# Patient Record
Sex: Male | Born: 1945 | Hispanic: No | Marital: Married | Smoking: Never smoker
Health system: Southern US, Community
[De-identification: ages and names within clinical notes are randomized; demographics above are authoritative.]

## PROBLEM LIST (undated history)

## (undated) DIAGNOSIS — H269 Unspecified cataract: Secondary | ICD-10-CM

## (undated) HISTORY — DX: Unspecified cataract: H26.9

## (undated) HISTORY — PX: TONSILLECTOMY: SUR1361

---

## 2009-03-26 ENCOUNTER — Encounter (INDEPENDENT_AMBULATORY_CARE_PROVIDER_SITE_OTHER): Payer: Self-pay | Admitting: *Deleted

## 2010-05-10 HISTORY — PX: OTHER SURGICAL HISTORY: SHX169

## 2013-04-27 ENCOUNTER — Encounter (INDEPENDENT_AMBULATORY_CARE_PROVIDER_SITE_OTHER): Payer: Self-pay | Admitting: Ophthalmology

## 2013-05-23 ENCOUNTER — Encounter (INDEPENDENT_AMBULATORY_CARE_PROVIDER_SITE_OTHER): Payer: Self-pay | Admitting: Ophthalmology

## 2013-05-31 ENCOUNTER — Encounter (INDEPENDENT_AMBULATORY_CARE_PROVIDER_SITE_OTHER): Payer: Self-pay | Admitting: Ophthalmology

## 2013-06-08 ENCOUNTER — Encounter (INDEPENDENT_AMBULATORY_CARE_PROVIDER_SITE_OTHER): Payer: Self-pay | Admitting: Ophthalmology

## 2013-06-14 ENCOUNTER — Encounter (INDEPENDENT_AMBULATORY_CARE_PROVIDER_SITE_OTHER): Payer: Self-pay | Admitting: Ophthalmology

## 2013-06-21 ENCOUNTER — Encounter (INDEPENDENT_AMBULATORY_CARE_PROVIDER_SITE_OTHER): Payer: Self-pay | Admitting: Ophthalmology

## 2013-06-27 ENCOUNTER — Encounter (INDEPENDENT_AMBULATORY_CARE_PROVIDER_SITE_OTHER): Payer: Self-pay | Admitting: Ophthalmology

## 2016-10-26 ENCOUNTER — Ambulatory Visit (INDEPENDENT_AMBULATORY_CARE_PROVIDER_SITE_OTHER): Payer: Medicare Other | Admitting: Orthopaedic Surgery

## 2016-10-26 ENCOUNTER — Encounter (INDEPENDENT_AMBULATORY_CARE_PROVIDER_SITE_OTHER): Payer: Self-pay | Admitting: Orthopaedic Surgery

## 2016-10-26 VITALS — BP 127/77 | HR 68 | Ht 69.0 in | Wt 203.0 lb

## 2016-10-26 DIAGNOSIS — M7712 Lateral epicondylitis, left elbow: Secondary | ICD-10-CM | POA: Diagnosis not present

## 2016-10-26 NOTE — Progress Notes (Signed)
Office Visit Note   Patient: Steven Bonilla           Date of Birth: 01/02/1946           MRN: [id number] Visit Date: 10/26/2016              Requested by: No referring provider defined for this encounter. PCP: No primary care provider on file.   Assessment & Plan: Visit Diagnoses:  1. Lateral epicondylitis, left elbow     Plan: Injection performed to continue with ice intermittent Advil. He can use his tennis elbow splint. He'll let me know if he has persistent problems.  Follow-Up Instructions: Return if symptoms worsen or fail to improve.   Orders:  Orders Placed This Encounter  Procedures  . Medium Joint Injection/Arthrocentesis   No orders of the defined types were placed in this encounter.     Procedures: Medium Joint Inj Date/Time: 10/26/2016 11:24 AM Performed by: Marybelle Killings Authorized by: Rodell Perna C   Consent Given by:  Patient Indications:  Pain Location:  Elbow Site:  L lateral epicondyle Needle Size:  22 G Needle Length:  1.5 inches Approach:  Anterolateral Ultrasound Guided: No   Fluoroscopic Guidance: No   Medications:  1 mL bupivacaine 0.5 %; 1 mL lidocaine 1 %; 40 mg methylPREDNISolone acetate 40 MG/ML Patient tolerance:  Patient tolerated the procedure well with no immediate complications      Clinical Data: No additional findings.   Subjective: Chief Complaint  Patient presents with  . Left Elbow - Pain    HPI 71-year-old male with left elbow pain. States had an injection 2016 did well. He said increased left elbow pain for 3-4 weeks, he wore tennis elbow band and states his pain is increased despite Advil, blue emu cream, and rest. He states he has some matches coming up shortly that he has to plan any requested repeat injection.   Review of Systems patient had been very healthy had cataract surgery 2012 otherwise is not on any medication works in Insurance underwriter drinks a glass of wine nightly otherwise negative 14 point review of  systems.   Objective: Vital Signs: BP 127/77   Pulse 68   Ht 5\' 9"  (1.753 m)   Wt 203 lb (92.1 kg)   BMI 29.98 kg/m   Physical Exam  Constitutional: He is oriented to person, place, and time. He appears well-developed and well-nourished.  HENT:  Head: Normocephalic and atraumatic.  Eyes: EOM are normal. Pupils are equal, round, and reactive to light.  Neck: No tracheal deviation present. No thyromegaly present.  Cardiovascular: Normal rate.   Pulmonary/Chest: Effort normal. He has no wheezes.  Abdominal: Soft. Bowel sounds are normal.  Musculoskeletal:  Patient has good cervical range of motion of shoulder impingement. Exquisite tenderness over his left lateral epicondyle pain with resisted wrist extension no palpable defects. Collateral ligaments are stable radial head is normal. Pain with gripping and squeezing. Medial epicondyle is normal to palpation full elbow range of motion normal supination pronation.  Neurological: He is alert and oriented to person, place, and time.  Skin: Skin is warm and dry. Capillary refill takes less than 2 seconds.  Psychiatric: He has a normal mood and affect. His behavior is normal. Judgment and thought content normal.    Ortho Exam  Specialty Comments:  No specialty comments available.  Imaging: No results found.   PMFS History: Patient Active Problem List   Diagnosis Date Noted  . Lateral epicondylitis, left elbow 10/28/2016  Past Medical History:  Diagnosis Date  . Cataracts, bilateral     No family history on file.  Past Surgical History:  Procedure Laterality Date  . cataracts  2012   Social History   Occupational History  . Not on file.   Social History Main Topics  . Smoking status: Never Smoker  . Smokeless tobacco: Never Used  . Alcohol use Yes  . Drug use: No  . Sexual activity: Not on file

## 2016-10-28 DIAGNOSIS — M7712 Lateral epicondylitis, left elbow: Secondary | ICD-10-CM | POA: Insufficient documentation

## 2016-10-28 MED ORDER — BUPIVACAINE HCL 0.5 % IJ SOLN
1.0000 mL | INTRAMUSCULAR | Status: AC | PRN
  Administered 2016-10-26: 1 mL via INTRA_ARTICULAR

## 2016-10-28 MED ORDER — LIDOCAINE HCL 1 % IJ SOLN
1.0000 mL | INTRAMUSCULAR | Status: AC | PRN
  Administered 2016-10-26: 1 mL

## 2016-10-28 MED ORDER — METHYLPREDNISOLONE ACETATE 40 MG/ML IJ SUSP
40.0000 mg | INTRAMUSCULAR | Status: AC | PRN
  Administered 2016-10-26: 40 mg via INTRA_ARTICULAR

## 2018-11-29 ENCOUNTER — Other Ambulatory Visit: Payer: Self-pay

## 2018-11-29 DIAGNOSIS — Z20822 Contact with and (suspected) exposure to covid-19: Secondary | ICD-10-CM

## 2018-12-01 LAB — NOVEL CORONAVIRUS, NAA: SARS-CoV-2, NAA: NOT DETECTED

## 2018-12-01 LAB — SPECIMEN STATUS REPORT

## 2018-12-07 ENCOUNTER — Encounter: Payer: Self-pay | Admitting: Gastroenterology

## 2018-12-07 ENCOUNTER — Encounter: Payer: Self-pay | Admitting: Family Medicine

## 2018-12-07 ENCOUNTER — Ambulatory Visit (INDEPENDENT_AMBULATORY_CARE_PROVIDER_SITE_OTHER): Payer: Medicare Other | Admitting: Family Medicine

## 2018-12-07 VITALS — BP 118/70 | HR 67 | Ht 69.0 in | Wt 199.0 lb

## 2018-12-07 DIAGNOSIS — Z0000 Encounter for general adult medical examination without abnormal findings: Secondary | ICD-10-CM | POA: Diagnosis not present

## 2018-12-07 DIAGNOSIS — N529 Male erectile dysfunction, unspecified: Secondary | ICD-10-CM

## 2018-12-07 DIAGNOSIS — Z125 Encounter for screening for malignant neoplasm of prostate: Secondary | ICD-10-CM | POA: Diagnosis not present

## 2018-12-07 DIAGNOSIS — Z0001 Encounter for general adult medical examination with abnormal findings: Secondary | ICD-10-CM | POA: Insufficient documentation

## 2018-12-07 DIAGNOSIS — Z131 Encounter for screening for diabetes mellitus: Secondary | ICD-10-CM | POA: Insufficient documentation

## 2018-12-07 LAB — COMPREHENSIVE METABOLIC PANEL
ALT: 13 U/L (ref 0–53)
AST: 16 U/L (ref 0–37)
Albumin: 4.3 g/dL (ref 3.5–5.2)
Alkaline Phosphatase: 37 U/L — ABNORMAL LOW (ref 39–117)
BUN: 13 mg/dL (ref 6–23)
CO2: 28 mEq/L (ref 19–32)
Calcium: 9.2 mg/dL (ref 8.4–10.5)
Chloride: 104 mEq/L (ref 96–112)
Creatinine, Ser: 1.05 mg/dL (ref 0.40–1.50)
GFR: 69.25 mL/min (ref >60.00)
Glucose, Bld: 109 mg/dL — ABNORMAL HIGH (ref 70–99)
Potassium: 4.3 mEq/L (ref 3.5–5.1)
Sodium: 138 mEq/L (ref 135–145)
Total Bilirubin: 0.9 mg/dL (ref 0.2–1.2)
Total Protein: 6.8 g/dL (ref 6.0–8.3)

## 2018-12-07 LAB — LIPID PANEL
Cholesterol: 221 mg/dL — ABNORMAL HIGH (ref 0–200)
HDL: 56.9 mg/dL (ref >39.00)
LDL Cholesterol: 146 mg/dL — ABNORMAL HIGH (ref 0–99)
NonHDL: 163.89
Total CHOL/HDL Ratio: 4
Triglycerides: 91 mg/dL (ref 0.0–149.0)
VLDL: 18.2 mg/dL (ref 0.0–40.0)

## 2018-12-07 LAB — PSA: PSA: 1.14 ng/mL (ref 0.10–4.00)

## 2018-12-07 LAB — URINALYSIS, ROUTINE W REFLEX MICROSCOPIC
Bilirubin Urine: NEGATIVE
Ketones, ur: NEGATIVE
Leukocytes,Ua: NEGATIVE
Nitrite: NEGATIVE
RBC / HPF: NONE SEEN (ref 0–?)
Specific Gravity, Urine: 1.025 (ref 1.000–1.030)
Total Protein, Urine: NEGATIVE
Urine Glucose: NEGATIVE
Urobilinogen, UA: 1 (ref 0.0–1.0)
WBC, UA: NONE SEEN (ref 0–?)
pH: 5.5 (ref 5.0–8.0)

## 2018-12-07 LAB — CBC
HCT: 44.9 % (ref 39.0–52.0)
Hemoglobin: 15.2 g/dL (ref 13.0–17.0)
MCHC: 33.9 g/dL (ref 30.0–36.0)
MCV: 99.9 fl (ref 78.0–100.0)
Platelets: 240 10*3/uL (ref 150.0–400.0)
RBC: 4.5 Mil/uL (ref 4.22–5.81)
RDW: 12.7 % (ref 11.5–15.5)
WBC: 5 10*3/uL (ref 4.0–10.5)

## 2018-12-07 MED ORDER — SILDENAFIL CITRATE 20 MG PO TABS: ORAL_TABLET | ORAL | 1 refills | Status: DC

## 2018-12-07 NOTE — Patient Instructions (Signed)
Health Maintenance After Age 73 After age 39, you are at a higher risk for certain long-term diseases and infections as well as injuries from falls. Falls are a major cause of broken bones and head injuries in people who are older than age 66. Getting regular preventive care can help to keep you healthy and well. Preventive care includes getting regular testing and making lifestyle changes as recommended by your health care provider. Talk with your health care provider about:  Which screenings and tests you should have. A screening is a test that checks for a disease when you have no symptoms.  A diet and exercise plan that is right for you. What should I know about screenings and tests to prevent falls? Screening and testing are the best ways to find a health problem early. Early diagnosis and treatment give you the best chance of managing medical conditions that are common after age 8. Certain conditions and lifestyle choices may make you more likely to have a fall. Your health care provider may recommend:  Regular vision checks. Poor vision and conditions such as cataracts can make you more likely to have a fall. If you wear glasses, make sure to get your prescription updated if your vision changes.  Medicine review. Work with your health care provider to regularly review all of the medicines you are taking, including over-the-counter medicines. Ask your health care provider about any side effects that may make you more likely to have a fall. Tell your health care provider if any medicines that you take make you feel dizzy or sleepy.  Osteoporosis screening. Osteoporosis is a condition that causes the bones to get weaker. This can make the bones weak and cause them to break more easily.  Blood pressure screening. Blood pressure changes and medicines to control blood pressure can make you feel dizzy.  Strength and balance checks. Your health care provider may recommend certain tests to check your  strength and balance while standing, walking, or changing positions.  Foot health exam. Foot pain and numbness, as well as not wearing proper footwear, can make you more likely to have a fall.  Depression screening. You may be more likely to have a fall if you have a fear of falling, feel emotionally low, or feel unable to do activities that you used to do.  Alcohol use screening. Using too much alcohol can affect your balance and may make you more likely to have a fall. What actions can I take to lower my risk of falls? General instructions  Talk with your health care provider about your risks for falling. Tell your health care provider if: ? You fall. Be sure to tell your health care provider about all falls, even ones that seem minor. ? You feel dizzy, sleepy, or off-balance.  Take over-the-counter and prescription medicines only as told by your health care provider. These include any supplements.  Eat a healthy diet and maintain a healthy weight. A healthy diet includes low-fat dairy products, low-fat (lean) meats, and fiber from whole grains, beans, and lots of fruits and vegetables. Home safety  Remove any tripping hazards, such as rugs, cords, and clutter.  Install safety equipment such as grab bars in bathrooms and safety rails on stairs.  Keep rooms and walkways well-lit. Activity   Follow a regular exercise program to stay fit. This will help you maintain your balance. Ask your health care provider what types of exercise are appropriate for you.  If you need a cane or  walker, use it as recommended by your health care provider.  Wear supportive shoes that have nonskid soles. Lifestyle  Do not drink alcohol if your health care provider tells you not to drink.  If you drink alcohol, limit how much you have: ? 0-1 drink a day for women. ? 0-2 drinks a day for men.  Be aware of how much alcohol is in your drink. In the [place] one drink equals one typical bottle of beer (12  oz), one-half glass of wine (5 oz), or one shot of hard liquor (1 oz).  Do not use any products that contain nicotine or tobacco, such as cigarettes and e-cigarettes. If you need help quitting, ask your health care provider. Summary  Having a healthy lifestyle and getting preventive care can help to protect your health and wellness after age 65.  Screening and testing are the best way to find a health problem early and help you avoid having a fall. Early diagnosis and treatment give you the best chance for managing medical conditions that are more common for people who are older than age 65.  Falls are a major cause of broken bones and head injuries in people who are older than age 65. Take precautions to prevent a fall at home.  Work with your health care provider to learn what changes you can make to improve your health and wellness and to prevent falls. This information is not intended to replace advice given to you by your health care provider. Make sure you discuss any questions you have with your health care provider. Document Released: 03/09/2017 Document Revised: 08/17/2018 Document Reviewed: 03/09/2017 Elsevier Patient Education  2020 [employer] Inc[employer]  Preventive Care 65 Years and Older, Male Preventive care refers to lifestyle choices and visits with your health care provider that can promote health and wellness. This includes:  A yearly physical exam. This is also called an annual well check.  Regular dental and eye exams.  Immunizations.  Screening for certain conditions.  Healthy lifestyle choices, such as diet and exercise. What can I expect for my preventive care visit? Physical exam Your health care provider will check:  Height and weight. These may be used to calculate body mass index (BMI), which is a measurement that tells if you are at a healthy weight.  Heart rate and blood pressure.  Your skin for abnormal spots. Counseling Your health care provider may ask  you questions about:  Alcohol, tobacco, and drug use.  Emotional well-being.  Home and relationship well-being.  Sexual activity.  Eating habits.  History of falls.  Memory and ability to understand (cognition).  Work and work environment. What immunizations do I need?  Influenza (flu) vaccine  This is recommended every year. Tetanus, diphtheria, and pertussis (Tdap) vaccine  You may need a Td booster every 10 years. Varicella (chickenpox) vaccine  You may need this vaccine if you have not already been vaccinated. Zoster (shingles) vaccine  You may need this after age 60. Pneumococcal conjugate (PCV13) vaccine  One dose is recommended after age 65. Pneumococcal polysaccharide (PPSV23) vaccine  One dose is recommended after age 65. Measles, mumps, and rubella (MMR) vaccine  You may need at least one dose of MMR if you were born in 1957 or later. You may also need a second dose. Meningococcal conjugate (MenACWY) vaccine  You may need this if you have certain conditions. Hepatitis A vaccine  You may need this if you have certain conditions or if you travel or work   in places where you may be exposed to hepatitis A. Hepatitis B vaccine  You may need this if you have certain conditions or if you travel or work in places where you may be exposed to hepatitis B. Haemophilus influenzae type b (Hib) vaccine  You may need this if you have certain conditions. You may receive vaccines as individual doses or as more than one vaccine together in one shot (combination vaccines). Talk with your health care provider about the risks and benefits of combination vaccines. What tests do I need? Blood tests  Lipid and cholesterol levels. These may be checked every 5 years, or more frequently depending on your overall health.  Hepatitis C test.  Hepatitis B test. Screening  Lung cancer screening. You may have this screening every year starting at age 33 if you have a  30-pack-year history of smoking and currently smoke or have quit within the past 15 years.  Colorectal cancer screening. All adults should have this screening starting at age 23 and continuing until age 65. Your health care provider may recommend screening at age 85 if you are at increased risk. You will have tests every 1-10 years, depending on your results and the type of screening test.  Prostate cancer screening. Recommendations will vary depending on your family history and other risks.  Diabetes screening. This is done by checking your blood sugar (glucose) after you have not eaten for a while (fasting). You may have this done every 1-3 years.  Abdominal aortic aneurysm (AAA) screening. You may need this if you are a current or former smoker.  Sexually transmitted disease (STD) testing. Follow these instructions at home: Eating and drinking  Eat a diet that includes fresh fruits and vegetables, whole grains, lean protein, and low-fat dairy products. Limit your intake of foods with high amounts of sugar, saturated fats, and salt.  Take vitamin and mineral supplements as recommended by your health care provider.  Do not drink alcohol if your health care provider tells you not to drink.  If you drink alcohol: ? Limit how much you have to 0-2 drinks a day. ? Be aware of how much alcohol is in your drink. In the [place] one drink equals one 12 oz bottle of beer (355 mL), one 5 oz glass of wine (148 mL), or one 1 oz glass of hard liquor (44 mL). Lifestyle  Take daily care of your teeth and gums.  Stay active. Exercise for at least 30 minutes on 5 or more days each week.  Do not use any products that contain nicotine or tobacco, such as cigarettes, e-cigarettes, and chewing tobacco. If you need help quitting, ask your health care provider.  If you are sexually active, practice safe sex. Use a condom or other form of protection to prevent STIs (sexually transmitted infections).  Talk  with your health care provider about taking a low-dose aspirin or statin. What's next?  Visit your health care provider once a year for a well check visit.  Ask your health care provider how often you should have your eyes and teeth checked.  Stay up to date on all vaccines. This information is not intended to replace advice given to you by your health care provider. Make sure you discuss any questions you have with your health care provider. Document Released: 05/23/2015 Document Revised: 04/20/2018 Document Reviewed: 04/20/2018 Elsevier Patient Education  2020 [employer].

## 2018-12-07 NOTE — Progress Notes (Addendum)
Established Patient Office Visit  Subjective:  Patient ID: Steven Bonilla, male    DOB: August 04, 1945  Age: 73 y.o. MRN: [id number]  CC:  Chief Complaint  Patient presents with  . Establish Care  . Annual Exam    HPI Bentzion Dauria presents for routine complete physical.  Patient has no chronic illnesses that he is aware of.  He enjoys good health.  Has not had a regular checkup in years and his wife has been compelling him to get 1.  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  He is active by playing golf.  He does not smoke or use illicit drugs.  He will drink 1 glass of red wine daily on a regular basis.  Occasional difficulty with ED but denies decrease in libido.  Urine flow is good.  Has not had a colonoscopy in years.  No recent eye check.  Is able to see the dentist regularly.  Past Medical History:  Diagnosis Date  . Cataracts, bilateral     Past Surgical History:  Procedure Laterality Date  . cataracts  2012    History reviewed. No pertinent family history.  Social History   Socioeconomic History  . Marital status: Divorced    Spouse name: Not on file  . Number of children: Not on file  . Years of education: Not on file  . Highest education level: Not on file  Occupational History  . Not on file  Social Needs  . Financial resource strain: Not on file  . Food insecurity    Worry: Not on file    Inability: Not on file  . Transportation needs    Medical: Not on file    Non-medical: Not on file  Tobacco Use  . Smoking status: Never Smoker  . Smokeless tobacco: Never Used  Substance and Sexual Activity  . Alcohol use: Yes  . Drug use: No  . Sexual activity: Not on file  Lifestyle  . Physical activity    Days per week: Not on file    Minutes per session: Not on file  . Stress: Not on file  Relationships  . Social Herbalist on phone: Not on file    Gets together: Not on file    Attends religious service: Not on file    Active member of club  or organization: Not on file    Attends meetings of clubs or organizations: Not on file    Relationship status: Not on file  . Intimate partner violence    Fear of current or ex partner: Not on file    Emotionally abused: Not on file    Physically abused: Not on file    Forced sexual activity: Not on file  Other Topics Concern  . Not on file  Social History Narrative  . Not on file    No outpatient medications prior to visit.   No facility-administered medications prior to visit.     No Known Allergies  ROS Review of Systems  Constitutional: Negative.   HENT: Negative.   Eyes: Negative for photophobia and visual disturbance.  Respiratory: Negative.   Cardiovascular: Negative.   Gastrointestinal: Negative.   Endocrine: Negative for polyphagia and polyuria.  Genitourinary: Negative for difficulty urinating, frequency and urgency.  Musculoskeletal: Negative for gait problem and joint swelling.  Skin: Negative for pallor.  Allergic/Immunologic: Negative for immunocompromised state.  Neurological: Negative for light-headedness and headaches.  Hematological: Does not bruise/bleed easily.  Psychiatric/Behavioral: Negative.  Objective:    Physical Exam  Constitutional: He is oriented to person, place, and time. He appears well-developed and well-nourished. No distress.  HENT:  Head: Normocephalic and atraumatic.  Right Ear: External ear normal.  Left Ear: External ear normal.  Mouth/Throat: Oropharynx is clear and moist. No oropharyngeal exudate.  Eyes: Pupils are equal, round, and reactive to light. Conjunctivae are normal. Right eye exhibits no discharge. Left eye exhibits no discharge. No scleral icterus.  Neck: No JVD present. No tracheal deviation present. No thyromegaly present.  Cardiovascular: Normal rate, regular rhythm and normal heart sounds.  Pulmonary/Chest: Effort normal and breath sounds normal. No stridor. No respiratory distress. He has no wheezes. He  has no rales.  Abdominal: Bowel sounds are normal. He exhibits no distension. There is no abdominal tenderness. There is no rebound and no guarding.  Genitourinary: Rectum:     Guaiac result negative.     No rectal mass, anal fissure, tenderness, external hemorrhoid, internal hemorrhoid or abnormal anal tone.  Prostate is enlarged. Prostate is not tender.  Musculoskeletal:        General: No edema.  Lymphadenopathy:    He has no cervical adenopathy.  Neurological: He is alert and oriented to person, place, and time.  Skin: Skin is warm and dry. He is not diaphoretic.  Psychiatric: He has a normal mood and affect. His behavior is normal.    BP 118/70   Pulse 67   Ht _0  (1.753 m)   Wt 199 lb (90.3 kg)   SpO2 100%   BMI 29.39 kg/m  Wt Readings from Last 3 Encounters:  12/07/18 199 lb (90.3 kg)  10/26/16 203 lb (92.1 kg)   BP Readings from Last 3 Encounters:  12/07/18 118/70  10/26/16 127/77   Guideline developer:  UpToDate (see UpToDate for funding source) Date Released: June 2014  Health Maintenance Due  Topic Date Due  . Hepatitis C Screening  06-16-1945  . COLONOSCOPY  01/07/1996    There are no preventive care reminders to display for this patient.  No results found for: TSH No results found for: WBC, HGB, HCT, MCV, PLT No results found for: NA, K, CHLORIDE, CO2, GLUCOSE, BUN, CREATININE, BILITOT, ALKPHOS, AST, ALT, PROT, ALBUMIN, CALCIUM, ANIONGAP, EGFR, GFR No results found for: CHOL No results found for: HDL No results found for: LDLCALC No results found for: TRIG No results found for: CHOLHDL No results found for: HGBA1C    Assessment & Plan:   Problem List Items Addressed This Visit      Other   Vasculogenic erectile dysfunction   Relevant Medications   sildenafil (REVATIO) 20 MG tablet   Other Relevant Orders   CBC   Lipid panel   Healthcare maintenance - Primary   Relevant Orders   Comprehensive metabolic panel   Lipid panel   PSA    Urinalysis, Routine w reflex microscopic   Ambulatory referral to Gastroenterology   Ambulatory referral to Ophthalmology      Meds ordered this encounter  Medications  . sildenafil (REVATIO) 20 MG tablet    Sig: Take 3-5 daily  45 minutes prior as needed.    Dispense:  50 tablet    Refill:  1  The 10-year ASCVD risk score Mikey Bussing DC Jr., et al., 2013) is: 18%   Values used to calculate the score:     Age: 14 years     Sex: Male     Is Non-Hispanic African American: No  Diabetic: No     Tobacco smoker: No     Systolic Blood Pressure: 262 mmHg     Is BP treated: No     HDL Cholesterol: 56.9 mg/dL     Total Cholesterol: 221 mg/dL  Follow-up: Return in about 6 months (around 06/09/2019).

## 2018-12-26 ENCOUNTER — Other Ambulatory Visit: Payer: Self-pay

## 2018-12-26 ENCOUNTER — Ambulatory Visit (AMBULATORY_SURGERY_CENTER): Payer: Self-pay | Admitting: *Deleted

## 2018-12-26 VITALS — Temp 96.8°F | Ht 69.5 in | Wt 201.0 lb

## 2018-12-26 DIAGNOSIS — Z1211 Encounter for screening for malignant neoplasm of colon: Secondary | ICD-10-CM

## 2018-12-26 NOTE — Progress Notes (Signed)
No egg or soy allergy known to patient  No issues with past sedation with any surgeries  or procedures, no intubation problems  No diet pills per patient No home 02 use per patient  No blood thinners per patient  Pt denies issues with constipation  No A fib or A flutter  EMMI video sent to pt's e mail  

## 2019-01-09 ENCOUNTER — Encounter: Payer: Medicare Other | Admitting: Gastroenterology

## 2019-01-24 ENCOUNTER — Encounter: Payer: Medicare Other | Admitting: Gastroenterology

## 2019-02-12 ENCOUNTER — Encounter: Payer: Medicare Other | Admitting: Gastroenterology

## 2019-02-19 ENCOUNTER — Telehealth: Payer: Self-pay

## 2019-02-19 NOTE — Telephone Encounter (Signed)
Covid-19 screening questions   Do you now or have you had a fever in the last 14 days?  Do you have any respiratory symptoms of shortness of breath or cough now or in the last 14 days?  Do you have any family members or close contacts with diagnosed or suspected Covid-19 in the past 14 days?  Have you been tested for Covid-19 and found to be positive?       

## 2019-02-20 ENCOUNTER — Encounter: Payer: Self-pay | Admitting: Gastroenterology

## 2019-02-20 ENCOUNTER — Ambulatory Visit (AMBULATORY_SURGERY_CENTER): Payer: Medicare Other | Admitting: Gastroenterology

## 2019-02-20 ENCOUNTER — Other Ambulatory Visit: Payer: Self-pay

## 2019-02-20 VITALS — BP 95/57 | HR 55 | Temp 97.8°F | Resp 9 | Ht 70.0 in | Wt 201.0 lb

## 2019-02-20 DIAGNOSIS — Z1211 Encounter for screening for malignant neoplasm of colon: Secondary | ICD-10-CM

## 2019-02-20 DIAGNOSIS — D122 Benign neoplasm of ascending colon: Secondary | ICD-10-CM

## 2019-02-20 DIAGNOSIS — D123 Benign neoplasm of transverse colon: Secondary | ICD-10-CM

## 2019-02-20 DIAGNOSIS — D124 Benign neoplasm of descending colon: Secondary | ICD-10-CM | POA: Diagnosis not present

## 2019-02-20 DIAGNOSIS — D125 Benign neoplasm of sigmoid colon: Secondary | ICD-10-CM | POA: Diagnosis not present

## 2019-02-20 MED ORDER — SODIUM CHLORIDE 0.9 % IV SOLN: 500.0000 mL | Freq: Once | INTRAVENOUS | Status: DC

## 2019-02-20 NOTE — Progress Notes (Signed)
PT taken to PACU. Monitors in place. VSS. Report given to RN. 

## 2019-02-20 NOTE — Progress Notes (Signed)
Pt's states no medical or surgical changes since previsit or office visit. 

## 2019-02-20 NOTE — Op Note (Signed)
[facility] Patient Name: Steven Bonilla Procedure Date: 02/20/2019 12:04 PM MRN: [id number] Endoscopist: Remo Lipps P. Havery Moros , MD Age: 73 Referring MD:  Date of Birth: 1946/02/01 Gender: Male Account #: [id number] Procedure:                Colonoscopy Indications:              Screening for colorectal malignant neoplasm, This                            is the patient's first colonoscopy Medicines:                Monitored Anesthesia Care Procedure:                Pre-Anesthesia Assessment:                           - Prior to the procedure, a History and Physical                            was performed, and patient medications and                            allergies were reviewed. The patient's tolerance of                            previous anesthesia was also reviewed. The risks                            and benefits of the procedure and the sedation                            options and risks were discussed with the patient.                            All questions were answered, and informed consent                            was obtained. Prior Anticoagulants: The patient has                            taken no previous anticoagulant or antiplatelet                            agents. ASA Grade Assessment: II - A patient with                            mild systemic disease. After reviewing the risks                            and benefits, the patient was deemed in                            satisfactory condition to undergo the procedure.  After obtaining informed consent, the colonoscope                            was passed under direct vision. Throughout the                            procedure, the patient's blood pressure, pulse, and                            oxygen saturations were monitored continuously. The                            Colonoscope was introduced through the anus and                            advanced to the the  cecum, identified by                            appendiceal orifice and ileocecal valve. The                            colonoscopy was performed without difficulty. The                            patient tolerated the procedure well. The quality                            of the bowel preparation was good. The ileocecal                            valve, appendiceal orifice, and rectum were                            photographed. Scope In: 12:10:34 PM Scope Out: 12:37:28 PM Scope Withdrawal Time: 0 hours 24 minutes 27 seconds  Total Procedure Duration: 0 hours 26 minutes 54 seconds  Findings:                 The perianal and digital rectal examinations were                            normal.                           A diminutive polyp was found in the ascending                            colon. The polyp was sessile. The polyp was removed                            with a cold snare. Resection and retrieval were                            complete.  A 3 to 4 mm polyp was found in the transverse                            colon. The polyp was sessile. The polyp was removed                            with a cold snare. Resection and retrieval were                            complete.                           A 4 mm polyp was found in the splenic flexure. The                            polyp was sessile. The polyp was removed with a                            cold snare. Resection and retrieval were complete.                           Three sessile polyps were found in the descending                            colon. The polyps were 3 to 4 mm in size. These                            polyps were removed with a cold snare. Resection                            and retrieval were complete.                           Four sessile polyps were found in the sigmoid                            colon. The polyps were 3 to 4 mm in size. These                             polyps were removed with a cold snare. Resection                            and retrieval were complete.                           A few medium-mouthed diverticula were found in the                            sigmoid colon and ascending colon.                           Internal hemorrhoids were found during retroflexion.  Anal papilla(e) were hypertrophied.                           The exam was otherwise without abnormality. Complications:            No immediate complications. Estimated blood loss:                            Minimal. Estimated Blood Loss:     Estimated blood loss was minimal. Impression:               - One diminutive polyp in the ascending colon,                            removed with a cold snare. Resected and retrieved.                           - One 3 to 4 mm polyp in the transverse colon,                            removed with a cold snare. Resected and retrieved.                           - One 4 mm polyp at the splenic flexure, removed                            with a cold snare. Resected and retrieved.                           - Three 3 to 4 mm polyps in the descending colon,                            removed with a cold snare. Resected and retrieved.                           - Four 3 to 4 mm polyps in the sigmoid colon,                            removed with a cold snare. Resected and retrieved.                           - Diverticulosis in the sigmoid colon and in the                            ascending colon.                           - Internal hemorrhoids.                           - Anal papilla(e) were hypertrophied.                           - The examination was otherwise normal. Recommendation:           -  Patient has a contact number available for                            emergencies. The signs and symptoms of potential                            delayed complications were discussed with the                             patient. Return to normal activities tomorrow.                            Written discharge instructions were provided to the                            patient.                           - Resume previous diet.                           - Continue present medications.                           - Await pathology results. Remo Lipps P. Siniya Lichty, MD 02/20/2019 12:45:48 PM This report has been signed electronically.

## 2019-02-20 NOTE — Patient Instructions (Signed)
Handouts provided on: Polyps, Diverticulosis, and Hemorrhoids.  The polyps removed today have been sent off for pathology.  The results usually take about 10-14 days to receive.   You may resume your previous diet and medication schedule.  YOU HAD AN ENDOSCOPIC PROCEDURE TODAY AT [facility]:   Refer to the procedure report that was given to you for any specific questions about what was found during the examination.  If the procedure report does not answer your questions, please call your gastroenterologist to clarify.  If you requested that your care partner not be given the details of your procedure findings, then the procedure report has been included in a sealed envelope for you to review at your convenience later.  YOU SHOULD EXPECT: Some feelings of bloating in the abdomen. Passage of more gas than usual.  Walking can help get rid of the air that was put into your GI tract during the procedure and reduce the bloating. If you had a lower endoscopy (such as a colonoscopy or flexible sigmoidoscopy) you may notice spotting of blood in your stool or on the toilet paper. If you underwent a bowel prep for your procedure, you may not have a normal bowel movement for a few days.  Please Note:  You might notice some irritation and congestion in your nose or some drainage.  This is from the oxygen used during your procedure.  There is no need for concern and it should clear up in a day or so.  SYMPTOMS TO REPORT IMMEDIATELY:   Following lower endoscopy (colonoscopy or flexible sigmoidoscopy):  Excessive amounts of blood in the stool  Significant tenderness or worsening of abdominal pains  Swelling of the abdomen that is new, acute  Fever of 100F or higher   For urgent or emergent issues, a gastroenterologist can be reached at any hour by calling [phone].   DIET:  We do recommend a small meal at first, but then you may proceed to your regular diet.  Drink plenty of fluids  but you should avoid alcoholic beverages for 24 hours.  ACTIVITY:  You should plan to take it easy for the rest of today and you should NOT DRIVE or use heavy machinery until tomorrow (because of the sedation medicines used during the test).    FOLLOW UP: Our staff will call the number listed on your records 48-72 hours following your procedure to check on you and address any questions or concerns that you may have regarding the information given to you following your procedure. If we do not reach you, we will leave a message.  We will attempt to reach you two times.  During this call, we will ask if you have developed any symptoms of COVID 19. If you develop any symptoms (ie: fever, flu-like symptoms, shortness of breath, cough etc.) before then, please call [phone].  If you test positive for Covid 19 in the 2 weeks post procedure, please call and report this information to [place].    If any biopsies were taken you will be contacted by phone or by letter within the next 1-3 weeks.  Please call us at [phone] if you have not heard about the biopsies in 3 weeks.    SIGNATURES/CONFIDENTIALITY: You and/or your care partner have signed paperwork which will be entered into your electronic medical record.  These signatures attest to the fact that that the information above on your After Visit Summary has been reviewed and is understood.  Full responsibility  of the confidentiality of this discharge information lies with you and/or your care-partner.

## 2019-02-20 NOTE — Progress Notes (Signed)
Called to room to assist during endoscopic procedure.  Patient ID and intended procedure confirmed with present staff. Received instructions for my participation in the procedure from the performing physician.  

## 2019-02-22 ENCOUNTER — Telehealth: Payer: Self-pay | Admitting: *Deleted

## 2019-02-22 ENCOUNTER — Telehealth: Payer: Self-pay

## 2019-02-22 NOTE — Telephone Encounter (Signed)
  Follow up Call-  Call back number 02/20/2019  Post procedure Call Back phone  # [phone]  Permission to leave phone message Yes  Some recent data might be hidden     Patient questions:  Do you have a fever, pain , or abdominal swelling? No. Pain Score  0 *  Have you tolerated food without any problems? Yes.    Have you been able to return to your normal activities? Yes.    Do you have any questions about your discharge instructions: Diet   No. Medications  No. Follow up visit  No.  Do you have questions or concerns about your Care? No.  Actions: * If pain score is 4 or above: No action needed, pain <4.  1. Have you developed a fever since your procedure? no  2.   Have you had an respiratory symptoms (SOB or cough) since your procedure? no  3.   Have you tested positive for COVID 19 since your procedure no  4.   Have you had any family members/close contacts diagnosed with the COVID 19 since your procedure?  no   If yes to any of these questions please route to Joylene John, RN and Alphonsa Gin, Therapist, sports.

## 2019-02-22 NOTE — Telephone Encounter (Signed)
  Follow up Call-  Call back number 02/20/2019  Post procedure Call Back phone  # [phone]  Permission to leave phone message Yes  Some recent data might be hidden     Patient questions:  Message left to call us if necessary.

## 2019-02-27 ENCOUNTER — Telehealth: Payer: Self-pay | Admitting: Gastroenterology

## 2019-02-27 NOTE — Telephone Encounter (Signed)
Pt calling about path results. °

## 2019-02-27 NOTE — Telephone Encounter (Signed)
Patient called wanting to know the path results from his 02/20/19 colonoscopy. Said there was something on My Chart, but he couldn't open it.

## 2019-02-27 NOTE — Telephone Encounter (Signed)
I actually just released the results to him now and left an explanation in Mychart. Hopefully he receives that okay, if any other questions he can call back. Thanks

## 2019-03-28 ENCOUNTER — Other Ambulatory Visit: Payer: Self-pay

## 2019-03-29 ENCOUNTER — Encounter: Payer: Self-pay | Admitting: Family Medicine

## 2019-03-29 ENCOUNTER — Ambulatory Visit (INDEPENDENT_AMBULATORY_CARE_PROVIDER_SITE_OTHER): Payer: Medicare Other | Admitting: Family Medicine

## 2019-03-29 VITALS — BP 124/70 | HR 64 | Ht 70.0 in | Wt 211.0 lb

## 2019-03-29 DIAGNOSIS — R6882 Decreased libido: Secondary | ICD-10-CM | POA: Insufficient documentation

## 2019-03-29 DIAGNOSIS — Z0001 Encounter for general adult medical examination with abnormal findings: Secondary | ICD-10-CM | POA: Diagnosis not present

## 2019-03-29 LAB — TESTOSTERONE: Testosterone: 348.66 ng/dL (ref 300.00–890.00)

## 2019-03-29 NOTE — Progress Notes (Signed)
Established Patient Office Visit  Subjective:  Patient ID: Steven Bonilla, male    DOB: 1945-08-28  Age: 73 y.o. MRN: [id number]  CC:  Chief Complaint  Patient presents with  . testosterone    HPI Steven Bonilla presents for follow-up discussion about his elevated ASCVD risk score.  He is also interested in taking a supplement from the health store close to his testosterone levels.  Continues to exercise by playing golf several times a week and walks when he does play.  He does not smoke or have a history of diabetes or hypertension.  Tells me that his wife is encouraging the testosterone supplement.  He admits that his libido is somewhat decreased.  Using Revatio for ED as needed.  Has some issue with intermittent reflux that he has been treating successfully with Tums.  Past Medical History:  Diagnosis Date  . Cataracts, bilateral     Past Surgical History:  Procedure Laterality Date  . cataracts  2012  . TONSILLECTOMY     age 30     Family History  Problem Relation Age of Onset  . Colon polyps Neg Hx   . Colon cancer Neg Hx   . Esophageal cancer Neg Hx   . Rectal cancer Neg Hx   . Stomach cancer Neg Hx     Social History   Socioeconomic History  . Marital status: Divorced    Spouse name: Not on file  . Number of children: Not on file  . Years of education: Not on file  . Highest education level: Not on file  Occupational History  . Not on file  Social Needs  . Financial resource strain: Not on file  . Food insecurity    Worry: Not on file    Inability: Not on file  . Transportation needs    Medical: Not on file    Non-medical: Not on file  Tobacco Use  . Smoking status: Never Smoker  . Smokeless tobacco: Never Used  Substance and Sexual Activity  . Alcohol use: Yes    Alcohol/week: 7.0 standard drinks    Types: 7 Glasses of wine per week    Comment: wine daily   . Drug use: No  . Sexual activity: Not on file  Lifestyle  . Physical activity   Days per week: Not on file    Minutes per session: Not on file  . Stress: Not on file  Relationships  . Social Herbalist on phone: Not on file    Gets together: Not on file    Attends religious service: Not on file    Active member of club or organization: Not on file    Attends meetings of clubs or organizations: Not on file    Relationship status: Not on file  . Intimate partner violence    Fear of current or ex partner: Not on file    Emotionally abused: Not on file    Physically abused: Not on file    Forced sexual activity: Not on file  Other Topics Concern  . Not on file  Social History Narrative  . Not on file    Outpatient Medications Prior to Visit  Medication Sig Dispense Refill  . aspirin 81 MG chewable tablet Chew by mouth daily.    . Omega-3 Fatty Acids (FISH OIL) 1000 MG CAPS Take by mouth.    . sildenafil (REVATIO) 20 MG tablet Take 3-5 daily  45 minutes prior as needed. 50 tablet 1  No facility-administered medications prior to visit.     No Known Allergies  ROS Review of Systems  Constitutional: Negative.   HENT: Negative.   Eyes: Negative for photophobia and visual disturbance.  Respiratory: Negative.   Cardiovascular: Negative.   Gastrointestinal: Negative.   Genitourinary: Negative.   Psychiatric/Behavioral: Negative.       Objective:    Physical Exam  Constitutional: He is oriented to person, place, and time. He appears well-developed and well-nourished. No distress.  HENT:  Head: Normocephalic and atraumatic.  Right Ear: External ear normal.  Left Ear: External ear normal.  Eyes: Conjunctivae are normal. Right eye exhibits no discharge. Left eye exhibits no discharge. No scleral icterus.  Neck: No JVD present. No tracheal deviation present.  Pulmonary/Chest: Effort normal. No stridor.  Neurological: He is alert and oriented to person, place, and time.  Skin: Skin is warm and dry. He is not diaphoretic.  Psychiatric: He has a  normal mood and affect. His behavior is normal.    BP 124/70   Pulse 64   Ht 5\' 10"  (1.778 m)   Wt 211 lb (95.7 kg)   SpO2 92%   BMI 30.28 kg/m  Wt Readings from Last 3 Encounters:  03/29/19 211 lb (95.7 kg)  02/20/19 201 lb (91.2 kg)  12/26/18 201 lb (91.2 kg)   BP Readings from Last 3 Encounters:  03/29/19 124/70  02/20/19 (!) 95/57  12/07/18 118/70   Guideline developer:  UpToDate (see UpToDate for funding source) Date Released: June 2014  Health Maintenance Due  Topic Date Due  . Hepatitis C Screening  1945/10/21  . INFLUENZA VACCINE  12/09/2018  . PNA vac Low Risk Adult (2 of 2 - PPSV23) 02/21/2019    There are no preventive care reminders to display for this patient.  No results found for: TSH Lab Results  Component Value Date   WBC 5.0 12/07/2018   HGB 15.2 12/07/2018   HCT 44.9 12/07/2018   MCV 99.9 12/07/2018   PLT 240.0 12/07/2018   Lab Results  Component Value Date   NA 138 12/07/2018   K 4.3 12/07/2018   CO2 28 12/07/2018   GLUCOSE 109 (H) 12/07/2018   BUN 13 12/07/2018   CREATININE 1.05 12/07/2018   BILITOT 0.9 12/07/2018   ALKPHOS 37 (L) 12/07/2018   AST 16 12/07/2018   ALT 13 12/07/2018   PROT 6.8 12/07/2018   ALBUMIN 4.3 12/07/2018   CALCIUM 9.2 12/07/2018   GFR 69.25 12/07/2018   Lab Results  Component Value Date   CHOL 221 (H) 12/07/2018   Lab Results  Component Value Date   HDL 56.90 12/07/2018   Lab Results  Component Value Date   LDLCALC 146 (H) 12/07/2018   Lab Results  Component Value Date   TRIG 91.0 12/07/2018   Lab Results  Component Value Date   CHOLHDL 4 12/07/2018   No results found for: HGBA1C    Assessment & Plan:   Problem List Items Addressed This Visit      Other   Encounter for health maintenance examination with abnormal findings   Decreased libido - Primary   Relevant Orders   Testosterone      No orders of the defined types were placed in this encounter.   Follow-up: Return as  planned..    Patient was given information on the Mediterranean diet and exercising to lose weight.  He will make a concerted effort to lose 25 pounds and I suggested that that would help  his reflux energy levels and decreased libido.  We will follow his testosterone levels while he is taking the supplement.

## 2019-03-29 NOTE — Patient Instructions (Addendum)
Mediterranean Diet A Mediterranean diet refers to food and lifestyle choices that are based on the traditions of countries located on the [employer]. This way of eating has been shown to help prevent certain conditions and improve outcomes for people who have chronic diseases, like kidney disease and heart disease. What are tips for following this plan? Lifestyle  Cook and eat meals together with your family, when possible.  Drink enough fluid to keep your urine clear or pale yellow.  Be physically active every day. This includes: ? Aerobic exercise like running or swimming. ? Leisure activities like gardening, walking, or housework.  Get 7-8 hours of sleep each night.  If recommended by your health care provider, drink red wine in moderation. This means 1 glass a day for nonpregnant women and 2 glasses a day for men. A glass of wine equals 5 oz (150 mL). Reading food labels   Check the serving size of packaged foods. For foods such as rice and pasta, the serving size refers to the amount of cooked product, not dry.  Check the total fat in packaged foods. Avoid foods that have saturated fat or trans fats.  Check the ingredients list for added sugars, such as corn syrup. Shopping  At the grocery store, buy most of your food from the areas near the walls of the store. This includes: ? Fresh fruits and vegetables (produce). ? Grains, beans, nuts, and seeds. Some of these may be available in unpackaged forms or large amounts (in bulk). ? Fresh seafood. ? Poultry and eggs. ? Low-fat dairy products.  Buy whole ingredients instead of prepackaged foods.  Buy fresh fruits and vegetables in-season from local farmers markets.  Buy frozen fruits and vegetables in resealable bags.  If you do not have access to quality fresh seafood, buy precooked frozen shrimp or canned fish, such as tuna, salmon, or sardines.  Buy small amounts of raw or cooked vegetables, salads, or olives from  the deli or salad bar at your store.  Stock your pantry so you always have certain foods on hand, such as olive oil, canned tuna, canned tomatoes, rice, pasta, and beans. Cooking  Cook foods with extra-virgin olive oil instead of using butter or other vegetable oils.  Have meat as a side dish, and have vegetables or grains as your main dish. This means having meat in small portions or adding small amounts of meat to foods like pasta or stew.  Use beans or vegetables instead of meat in common dishes like chili or lasagna.  Experiment with different cooking methods. Try roasting or broiling vegetables instead of steaming or sauteing them.  Add frozen vegetables to soups, stews, pasta, or rice.  Add nuts or seeds for added healthy fat at each meal. You can add these to yogurt, salads, or vegetable dishes.  Marinate fish or vegetables using olive oil, lemon juice, garlic, and fresh herbs. Meal planning   Plan to eat 1 vegetarian meal one day each week. Try to work up to 2 vegetarian meals, if possible.  Eat seafood 2 or more times a week.  Have healthy snacks readily available, such as: ? Vegetable sticks with hummus. ? [place] yogurt. ? Fruit and nut trail mix.  Eat balanced meals throughout the week. This includes: ? Fruit: 2-3 servings a day ? Vegetables: 4-5 servings a day ? Low-fat dairy: 2 servings a day ? Fish, poultry, or lean meat: 1 serving a day ? Beans and legumes: 2 or more servings a week ?  Nuts and seeds: 1-2 servings a day °? Whole grains: 6-8 servings a day °? Extra-virgin olive oil: 3-4 servings a day °· Limit red meat and sweets to only a few servings a month °What are my food choices? °· Mediterranean diet °? Recommended °§ Grains: Whole-grain pasta. Brown rice. Bulgar wheat. Polenta. Couscous. Whole-wheat bread. Oatmeal. Quinoa. °§ Vegetables: Artichokes. Beets. Broccoli. Cabbage. Carrots. Eggplant. Green beans. Chard. Kale. Spinach. Onions. Leeks. Peas. Squash.  Tomatoes. Peppers. Radishes. °§ Fruits: Apples. Apricots. Avocado. Berries. Bananas. Cherries. Dates. Figs. Grapes. Lemons. Melon. Oranges. Peaches. Plums. Pomegranate. °§ Meats and other protein foods: Beans. Almonds. Sunflower seeds. Pine nuts. Peanuts. Cod. Salmon. Scallops. Shrimp. Tuna. Tilapia. Clams. Oysters. Eggs. °§ Dairy: Low-fat milk. Cheese. Greek yogurt. °§ Beverages: Water. Red wine. Herbal tea. °§ Fats and oils: Extra virgin olive oil. Avocado oil. Grape seed oil. °§ Sweets and desserts: Greek yogurt with honey. Baked apples. Poached pears. Trail mix. °§ Seasoning and other foods: Basil. Cilantro. Coriander. Cumin. Mint. Parsley. Sage. Rosemary. Tarragon. Garlic. Oregano. Thyme. Pepper. Balsalmic vinegar. Tahini. Hummus. Tomato sauce. Olives. Mushrooms. °? Limit these °§ Grains: Prepackaged pasta or rice dishes. Prepackaged cereal with added sugar. °§ Vegetables: Deep fried potatoes (french fries). °§ Fruits: Fruit canned in syrup. °§ Meats and other protein foods: Beef. Pork. Lamb. Poultry with skin. Hot dogs. Bacon. °§ Dairy: Ice cream. Sour cream. Whole milk. °§ Beverages: Juice. Sugar-sweetened soft drinks. Beer. Liquor and spirits. °§ Fats and oils: Butter. Canola oil. Vegetable oil. Beef fat (tallow). Lard. °§ Sweets and desserts: Cookies. Cakes. Pies. Candy. °§ Seasoning and other foods: Mayonnaise. Premade sauces and marinades. °The items listed may not be a complete list. Talk with your dietitian about what dietary choices are right for you. °Summary °· The Mediterranean diet includes both food and lifestyle choices. °· Eat a variety of fresh fruits and vegetables, beans, nuts, seeds, and whole grains. °· Limit the amount of red meat and sweets that you eat. °· Talk with your health care provider about whether it is safe for you to drink red wine in moderation. This means 1 glass a day for nonpregnant women and 2 glasses a day for men. A glass of wine equals 5 oz (150 mL). °This information  is not intended to replace advice given to you by your health care provider. Make sure you discuss any questions you have with your health care provider. °Document Released: 12/18/2015 Document Revised: 12/25/2015 Document Reviewed: 12/18/2015 °Elsevier Patient Education © 2020 [employer] ° °Exercising to Lose Weight °Exercise is structured, repetitive physical activity to improve fitness and health. Getting regular exercise is important for everyone. It is especially important if you are overweight. Being overweight increases your risk of heart disease, stroke, diabetes, high blood pressure, and several types of cancer. Reducing your calorie intake and exercising can help you lose weight. °Exercise is usually categorized as moderate or vigorous intensity. To lose weight, most people need to do a certain amount of moderate-intensity or vigorous-intensity exercise each week. °Moderate-intensity exercise ° °Moderate-intensity exercise is any activity that gets you moving enough to burn at least three times more energy (calories) than if you were sitting. °Examples of moderate exercise include: °· Walking a mile in 15 minutes. °· Doing light yard work. °· Biking at an easy pace. °Most people should get at least 150 minutes (2 hours and 30 minutes) a week of moderate-intensity exercise to maintain their body weight. °Vigorous-intensity exercise °Vigorous-intensity exercise is any activity that gets you moving enough   to burn at least six times more calories than if you were sitting. When you exercise at this intensity, you should be working hard enough that you are not able to carry on a conversation. °Examples of vigorous exercise include: °· Running. °· Playing a team sport, such as football, basketball, and soccer. °· Jumping rope. °Most people should get at least 75 minutes (1 hour and 15 minutes) a week of vigorous-intensity exercise to maintain their body weight. °How can exercise affect me? °When you exercise  enough to burn more calories than you eat, you lose weight. Exercise also reduces body fat and builds muscle. The more muscle you have, the more calories you burn. Exercise also: °· Improves mood. °· Reduces stress and tension. °· Improves your overall fitness, flexibility, and endurance. °· Increases bone strength. °The amount of exercise you need to lose weight depends on: °· Your age. °· The type of exercise. °· Any health conditions you have. °· Your overall physical ability. °Talk to your health care provider about how much exercise you need and what types of activities are safe for you. °What actions can I take to lose weight? °Nutrition ° °· Make changes to your diet as told by your health care provider or diet and nutrition specialist (dietitian). This may include: °? Eating fewer calories. °? Eating more protein. °? Eating less unhealthy fats. °? Eating a diet that includes fresh fruits and vegetables, whole grains, low-fat dairy products, and lean protein. °? Avoiding foods with added fat, salt, and sugar. °· Drink plenty of water while you exercise to prevent dehydration or heat stroke. °Activity °· Choose an activity that you enjoy and set realistic goals. Your health care provider can help you make an exercise plan that works for you. °· Exercise at a moderate or vigorous intensity most days of the week. °? The intensity of exercise may vary from person to person. You can tell how intense a workout is for you by paying attention to your breathing and heartbeat. Most people will notice their breathing and heartbeat get faster with more intense exercise. °· Do resistance training twice each week, such as: °? Push-ups. °? Sit-ups. °? Lifting weights. °? Using resistance bands. °· Getting short amounts of exercise can be just as helpful as long structured periods of exercise. If you have trouble finding time to exercise, try to include exercise in your daily routine. °? Get up, stretch, and walk around  every 30 minutes throughout the day. °? Go for a walk during your lunch break. °? Park your car farther away from your destination. °? If you take public transportation, get off one stop early and walk the rest of the way. °? Make phone calls while standing up and walking around. °? Take the stairs instead of elevators or escalators. °· Wear comfortable clothes and shoes with good support. °· Do not exercise so much that you hurt yourself, feel dizzy, or get very short of breath. °Where to find more information °· [place] Department of Health and Human Services: www.hhs.gov °· Centers for Disease Control and Prevention (CDC): www.cdc.gov °Contact a health care provider: °· Before starting a new exercise program. °· If you have questions or concerns about your weight. °· If you have a medical problem that keeps you from exercising. °Get help right away if you have any of the following while exercising: °· Injury. °· Dizziness. °· Difficulty breathing or shortness of breath that does not go away when you stop exercising. °· Chest   pain. °· Rapid heartbeat. °Summary °· Being overweight increases your risk of heart disease, stroke, diabetes, high blood pressure, and several types of cancer. °· Losing weight happens when you burn more calories than you eat. °· Reducing the amount of calories you eat in addition to getting regular moderate or vigorous exercise each week helps you lose weight. °This information is not intended to replace advice given to you by your health care provider. Make sure you discuss any questions you have with your health care provider. °Document Released: 05/29/2010 Document Revised: 05/09/2017 Document Reviewed: 05/09/2017 °Elsevier Patient Education © 2020 Elsevier Inc. ° °

## 2019-06-17 ENCOUNTER — Ambulatory Visit: Payer: Medicare Other | Attending: Internal Medicine

## 2019-06-17 DIAGNOSIS — Z23 Encounter for immunization: Secondary | ICD-10-CM | POA: Insufficient documentation

## 2019-06-17 NOTE — Progress Notes (Signed)
   Covid-19 Vaccination Clinic  Name:  Steven Bonilla    MRN: [id number] DOB: 1945-05-20  06/17/2019  [name] was observed post Covid-19 immunization for 15 minutes without incidence. He was provided with Vaccine Information Sheet and instruction to access the V-Safe system.   [name] was instructed to call 911 with any severe reactions post vaccine: Marland Kitchen Difficulty breathing  . Swelling of your face and throat  . A fast heartbeat  . A bad rash all over your body  . Dizziness and weakness    Immunizations Administered    Name Date Dose VIS Date Route   Pfizer COVID-19 Vaccine 06/17/2019  8:58 AM 0.3 mL 04/20/2019 Intramuscular   Manufacturer: [place]   Lot: [id number]   [place]: [id number]

## 2019-06-20 ENCOUNTER — Ambulatory Visit: Payer: Medicare Other

## 2019-07-12 ENCOUNTER — Ambulatory Visit: Payer: Medicare Other | Attending: Internal Medicine

## 2019-07-12 DIAGNOSIS — Z23 Encounter for immunization: Secondary | ICD-10-CM

## 2019-07-12 NOTE — Progress Notes (Signed)
   Covid-19 Vaccination Clinic  Name:  Jakin Moquete    MRN: [id number] DOB: [birth year]  07/12/2019  Mr. Mcvicker was observed post Covid-19 immunization for 15 minutes without incident. He was provided with Vaccine Information Sheet and instruction to access the V-Safe system.   Mr. Silerio was instructed to call 911 with any severe reactions post vaccine: Marland Kitchen Difficulty breathing  . Swelling of face and throat  . A fast heartbeat  . A bad rash all over body  . Dizziness and weakness   Immunizations Administered    Name Date Dose VIS Date Route   Pfizer COVID-19 Vaccine 07/12/2019  9:46 AM 0.3 mL 04/20/2019 Intramuscular   Manufacturer: [place]   Lot: [id number]   [place]: [id number]

## 2020-07-29 ENCOUNTER — Other Ambulatory Visit: Payer: Self-pay

## 2020-07-30 ENCOUNTER — Encounter: Payer: Self-pay | Admitting: Family Medicine

## 2020-07-30 ENCOUNTER — Ambulatory Visit (INDEPENDENT_AMBULATORY_CARE_PROVIDER_SITE_OTHER): Payer: Medicare Other | Admitting: Family Medicine

## 2020-07-30 VITALS — BP 122/70 | HR 68 | Temp 97.4°F | Ht 70.0 in | Wt 210.0 lb

## 2020-07-30 DIAGNOSIS — N529 Male erectile dysfunction, unspecified: Secondary | ICD-10-CM

## 2020-07-30 DIAGNOSIS — E785 Hyperlipidemia, unspecified: Secondary | ICD-10-CM | POA: Insufficient documentation

## 2020-07-30 DIAGNOSIS — M7052 Other bursitis of knee, left knee: Secondary | ICD-10-CM

## 2020-07-30 DIAGNOSIS — H524 Presbyopia: Secondary | ICD-10-CM | POA: Insufficient documentation

## 2020-07-30 MED ORDER — TADALAFIL 20 MG PO TABS: 10.0000 mg | ORAL_TABLET | ORAL | 11 refills | Status: DC | PRN

## 2020-07-30 NOTE — Progress Notes (Signed)
[facility] [address] [place] [address] Dept: [phone] Dept Fax: [phone]  Acute Office Visit  Subjective:    Patient ID: Steven Bonilla, male    DOB: Jan 22, 1946, 75 y.o..   MRN: [id number]  Chief Complaint  Patient presents with  . Acute Visit    Lt Knee pain x 5-6 weeks.   No known injury and plays golf a lot.      History of Present Illness:  Patient is in today with a 5-6 week history of left knee pain. He notes this most often when he is golfing, esp with lateral movements of the knee. He denies any specific injury to the knee. He has noted occasional popping of the knee when he is driving. He has found that heat from the shower does help with the discomfort. He had a friend, who is a massage therapist, do some deep massage one day and noted this felt better for a bit of time. She had identified him as having tight muscles in the anterior and medial thigh. He has a planned trip to [place] coming up in mid April. He is concerned about this hampering his game.  Additionally, Steven Bonilla has been prescribed sildenafil for ED. He notes that this is not as effective as he might like. He wonders about alternative medications for this.  Past Medical History: Patient Active Problem List   Diagnosis Date Noted  . 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 Other and unspecified hyperlipidemia 07/30/2020  . Decreased libido 03/29/2019  . 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 Encounter for health maintenance examination with abnormal findings 12/07/2018  . Lateral epicondylitis, left elbow 10/28/2016   Past Surgical History:  Procedure Laterality Date  . cataracts  2012  . TONSILLECTOMY     age 43    Family History  Problem Relation Age of Onset  . Colon polyps Neg Hx   . Colon cancer Neg Hx   . Esophageal cancer Neg Hx   . Rectal cancer Neg Hx   . Stomach cancer Neg Hx    Outpatient Medications Prior to Visit   Medication Sig Dispense Refill  . aspirin 81 MG chewable tablet Chew by mouth daily.    . Multiple Vitamins-Minerals (MULTIVITAMIN WITH MINERALS) tablet Take 1 tablet by mouth daily. gummies    . Omega-3 Fatty Acids (FISH OIL) 1000 MG CAPS Take by mouth.    . sildenafil (REVATIO) 20 MG tablet Take 3-5 daily  45 minutes prior as needed. 50 tablet 1   No facility-administered medications prior to visit.   No Known Allergies    Objective:   Today's Vitals   07/30/20 1044  BP: 122/70  Pulse: 68  Temp: (!) 97.4 F (36.3 C)  TempSrc: Temporal  SpO2: 98%  Weight: 210 lb (95.3 kg)  Height: 5\' 10"  (1.778 m)   Body mass index is 30.13 kg/m.   General: Well developed, well nourished. No acute distress. Extremities: Full ROM of the left knee. No joint swelling. There is no crepitance in the knee. There is mild   tenderness over the upper medial tibia at the pes anserinus. Varus/valgus testing and Lachman's are negative.   There is some pain at the pes anserinus with McMurray's, but no popping.  No edema noted. Psych: Alert and oriented. Normal mood and affect.  Health Maintenance Due  Topic Date Due  . Hepatitis C Screening  Never done  . TETANUS/TDAP  Never done  . PNA vac Low Risk  Adult (2 of 2 - PPSV23) 02/21/2019     Assessment & Plan:   1. Pes anserinus bursitis of left knee I recommend he take OTC Aleve 220 mg twice a day for two weeks. I also recommended heat over the bursa for 10 min 2-3 times a day. We did discuss him continuing to limir golfing as we initiate therapy. He will also continue some therapeutic massage for the leg muscles. If not improving with this, he may benefit from PT or a steroid injection.  2. Vasculogenic erectile dysfunction, unspecified vasculogenic erectile dysfunction type We discussed the various options. We will see if Cialis works better for him.  - tadalafil (CIALIS) 20 MG tablet; Take 0.5-1 tablets (10-20 mg total) by mouth every other day as  needed for erectile dysfunction.  Dispense: 5 tablet; Refill: 11  Haydee Salter, MD

## 2020-09-18 ENCOUNTER — Ambulatory Visit: Payer: Medicare Other | Admitting: Family Medicine

## 2020-10-07 ENCOUNTER — Ambulatory Visit (INDEPENDENT_AMBULATORY_CARE_PROVIDER_SITE_OTHER): Payer: Medicare Other | Admitting: Family Medicine

## 2020-10-07 ENCOUNTER — Other Ambulatory Visit: Payer: Self-pay

## 2020-10-07 ENCOUNTER — Encounter: Payer: Self-pay | Admitting: Family Medicine

## 2020-10-07 VITALS — BP 122/72 | HR 63 | Temp 97.0°F | Ht 70.0 in | Wt 207.6 lb

## 2020-10-07 DIAGNOSIS — E7800 Pure hypercholesterolemia, unspecified: Secondary | ICD-10-CM | POA: Insufficient documentation

## 2020-10-07 DIAGNOSIS — E041 Nontoxic single thyroid nodule: Secondary | ICD-10-CM | POA: Insufficient documentation

## 2020-10-07 DIAGNOSIS — R0989 Other specified symptoms and signs involving the circulatory and respiratory systems: Secondary | ICD-10-CM | POA: Diagnosis not present

## 2020-10-07 DIAGNOSIS — Z0001 Encounter for general adult medical examination with abnormal findings: Secondary | ICD-10-CM

## 2020-10-07 DIAGNOSIS — Z125 Encounter for screening for malignant neoplasm of prostate: Secondary | ICD-10-CM | POA: Diagnosis not present

## 2020-10-07 LAB — COMPREHENSIVE METABOLIC PANEL
ALT: 13 U/L (ref 0–53)
AST: 16 U/L (ref 0–37)
Albumin: 4.2 g/dL (ref 3.5–5.2)
Alkaline Phosphatase: 49 U/L (ref 39–117)
BUN: 11 mg/dL (ref 6–23)
CO2: 28 mEq/L (ref 19–32)
Calcium: 9.2 mg/dL (ref 8.4–10.5)
Chloride: 101 mEq/L (ref 96–112)
Creatinine, Ser: 1.11 mg/dL (ref 0.40–1.50)
GFR: 65.3 mL/min (ref >60.00)
Glucose, Bld: 103 mg/dL — ABNORMAL HIGH (ref 70–99)
Potassium: 4.2 mEq/L (ref 3.5–5.1)
Sodium: 138 mEq/L (ref 135–145)
Total Bilirubin: 0.9 mg/dL (ref 0.2–1.2)
Total Protein: 6.8 g/dL (ref 6.0–8.3)

## 2020-10-07 LAB — URINALYSIS, ROUTINE W REFLEX MICROSCOPIC
Bilirubin Urine: NEGATIVE
Ketones, ur: NEGATIVE
Leukocytes,Ua: NEGATIVE
Nitrite: NEGATIVE
Specific Gravity, Urine: 1.01 (ref 1.000–1.030)
Total Protein, Urine: NEGATIVE
Urine Glucose: NEGATIVE
Urobilinogen, UA: 0.2 (ref 0.0–1.0)
WBC, UA: NONE SEEN (ref 0–?)
pH: 6 (ref 5.0–8.0)

## 2020-10-07 LAB — LIPID PANEL
Cholesterol: 246 mg/dL — ABNORMAL HIGH (ref 0–200)
HDL: 65 mg/dL (ref >39.00)
LDL Cholesterol: 165 mg/dL — ABNORMAL HIGH (ref 0–99)
NonHDL: 181.13
Total CHOL/HDL Ratio: 4
Triglycerides: 81 mg/dL (ref 0.0–149.0)
VLDL: 16.2 mg/dL (ref 0.0–40.0)

## 2020-10-07 LAB — CBC
HCT: 46.4 % (ref 39.0–52.0)
Hemoglobin: 15.6 g/dL (ref 13.0–17.0)
MCHC: 33.6 g/dL (ref 30.0–36.0)
MCV: 100.3 fl — ABNORMAL HIGH (ref 78.0–100.0)
Platelets: 265 10*3/uL (ref 150.0–400.0)
RBC: 4.63 Mil/uL (ref 4.22–5.81)
RDW: 13.1 % (ref 11.5–15.5)
WBC: 4.9 10*3/uL (ref 4.0–10.5)

## 2020-10-07 LAB — PSA: PSA: 1.98 ng/mL (ref 0.10–4.00)

## 2020-10-07 LAB — TSH: TSH: 0.85 u[IU]/mL (ref 0.35–4.50)

## 2020-10-07 NOTE — Patient Instructions (Signed)
Health Maintenance After Age 75 After age 64, you are at a higher risk for certain long-term diseases and infections as well as injuries from falls. Falls are a major cause of broken bones and head injuries in people who are older than age 41. Getting regular preventive care can help to keep you healthy and well. Preventive care includes getting regular testing and making lifestyle changes as recommended by your health care provider. Talk with your health care provider about:  Which screenings and tests you should have. A screening is a test that checks for a disease when you have no symptoms.  A diet and exercise plan that is right for you. What should I know about screenings and tests to prevent falls? Screening and testing are the best ways to find a health problem early. Early diagnosis and treatment give you the best chance of managing medical conditions that are common after age 19. Certain conditions and lifestyle choices may make you more likely to have a fall. Your health care provider may recommend:  Regular vision checks. Poor vision and conditions such as cataracts can make you more likely to have a fall. If you wear glasses, make sure to get your prescription updated if your vision changes.  Medicine review. Work with your health care provider to regularly review all of the medicines you are taking, including over-the-counter medicines. Ask your health care provider about any side effects that may make you more likely to have a fall. Tell your health care provider if any medicines that you take make you feel dizzy or sleepy.  Osteoporosis screening. Osteoporosis is a condition that causes the bones to get weaker. This can make the bones weak and cause them to break more easily.  Blood pressure screening. Blood pressure changes and medicines to control blood pressure can make you feel dizzy.  Strength and balance checks. Your health care provider may recommend certain tests to check your  strength and balance while standing, walking, or changing positions.  Foot health exam. Foot pain and numbness, as well as not wearing proper footwear, can make you more likely to have a fall.  Depression screening. You may be more likely to have a fall if you have a fear of falling, feel emotionally low, or feel unable to do activities that you used to do.  Alcohol use screening. Using too much alcohol can affect your balance and may make you more likely to have a fall. What actions can I take to lower my risk of falls? General instructions  Talk with your health care provider about your risks for falling. Tell your health care provider if: ? You fall. Be sure to tell your health care provider about all falls, even ones that seem minor. ? You feel dizzy, sleepy, or off-balance.  Take over-the-counter and prescription medicines only as told by your health care provider. These include any supplements.  Eat a healthy diet and maintain a healthy weight. A healthy diet includes low-fat dairy products, low-fat (lean) meats, and fiber from whole grains, beans, and lots of fruits and vegetables. Home safety  Remove any tripping hazards, such as rugs, cords, and clutter.  Install safety equipment such as grab bars in bathrooms and safety rails on stairs.  Keep rooms and walkways well-lit. Activity  Follow a regular exercise program to stay fit. This will help you maintain your balance. Ask your health care provider what types of exercise are appropriate for you.  If you need a cane or walker,  use it as recommended by your health care provider.  Wear supportive shoes that have nonskid soles.   Lifestyle  Do not drink alcohol if your health care provider tells you not to drink.  If you drink alcohol, limit how much you have: ? 0-1 drink a day for women. ? 0-2 drinks a day for men.  Be aware of how much alcohol is in your drink. In the [place] one drink equals one typical bottle of beer (12  oz), one-half glass of wine (5 oz), or one shot of hard liquor (1 oz).  Do not use any products that contain nicotine or tobacco, such as cigarettes and e-cigarettes. If you need help quitting, ask your health care provider. Summary  Having a healthy lifestyle and getting preventive care can help to protect your health and wellness after age 45.  Screening and testing are the best way to find a health problem early and help you avoid having a fall. Early diagnosis and treatment give you the best chance for managing medical conditions that are more common for people who are older than age 79.  Falls are a major cause of broken bones and head injuries in people who are older than age 54. Take precautions to prevent a fall at home.  Work with your health care provider to learn what changes you can make to improve your health and wellness and to prevent falls. This information is not intended to replace advice given to you by your health care provider. Make sure you discuss any questions you have with your health care provider. Document Revised: 08/17/2018 Document Reviewed: 03/09/2017 Elsevier Patient Education  [place] 65 Years and Older, Male Preventive care refers to lifestyle choices and visits with your health care provider that can promote health and wellness. This includes:  A yearly physical exam. This is also called an annual wellness visit.  Regular dental and eye exams.  Immunizations.  Screening for certain conditions.  Healthy lifestyle choices, such as: ? Eating a healthy diet. ? Getting regular exercise. ? Not using drugs or products that contain nicotine and tobacco. ? Limiting alcohol use. What can I expect for my preventive care visit? Physical exam Your health care provider will check your:  Height and weight. These may be used to calculate your BMI (body mass index). BMI is a measurement that tells if you are at a healthy  weight.  Heart rate and blood pressure.  Body temperature.  Skin for abnormal spots. Counseling Your health care provider may ask you questions about your:  Past medical problems.  Family's medical history.  Alcohol, tobacco, and drug use.  Emotional well-being.  Home life and relationship well-being.  Sexual activity.  Diet, exercise, and sleep habits.  History of falls.  Memory and ability to understand (cognition).  Work and work Statistician.  Access to firearms. What immunizations do I need? Vaccines are usually given at various ages, according to a schedule. Your health care provider will recommend vaccines for you based on your age, medical history, and lifestyle or other factors, such as travel or where you work.   What tests do I need? Blood tests  Lipid and cholesterol levels. These may be checked every 5 years, or more often depending on your overall health.  Hepatitis C test.  Hepatitis B test. Screening  Lung cancer screening. You may have this screening every year starting at age 15 if you have a 30-pack-year history of smoking and currently  smoke or have quit within the past 15 years.  Colorectal cancer screening. ? All adults should have this screening starting at age 38 and continuing until age 5. ? Your health care provider may recommend screening at age 50 if you are at increased risk. ? You will have tests every 1-10 years, depending on your results and the type of screening test.  Prostate cancer screening. Recommendations will vary depending on your family history and other risks.  Genital exam to check for testicular cancer or hernias.  Diabetes screening. ? This is done by checking your blood sugar (glucose) after you have not eaten for a while (fasting). ? You may have this done every 1-3 years.  Abdominal aortic aneurysm (AAA) screening. You may need this if you are a current or former smoker.  STD (sexually transmitted disease)  testing, if you are at risk. Follow these instructions at home: Eating and drinking  Eat a diet that includes fresh fruits and vegetables, whole grains, lean protein, and low-fat dairy products. Limit your intake of foods with high amounts of sugar, saturated fats, and salt.  Take vitamin and mineral supplements as recommended by your health care provider.  Do not drink alcohol if your health care provider tells you not to drink.  If you drink alcohol: ? Limit how much you have to 0-2 drinks a day. ? Be aware of how much alcohol is in your drink. In the [place] one drink equals one 12 oz bottle of beer (355 mL), one 5 oz glass of wine (148 mL), or one 1 oz glass of hard liquor (44 mL).   Lifestyle  Take daily care of your teeth and gums. Brush your teeth every morning and night with fluoride toothpaste. Floss one time each day.  Stay active. Exercise for at least 30 minutes 5 or more days each week.  Do not use any products that contain nicotine or tobacco, such as cigarettes, e-cigarettes, and chewing tobacco. If you need help quitting, ask your health care provider.  Do not use drugs.  If you are sexually active, practice safe sex. Use a condom or other form of protection to prevent STIs (sexually transmitted infections).  Talk with your health care provider about taking a low-dose aspirin or statin.  Find healthy ways to cope with stress, such as: ? Meditation, yoga, or listening to music. ? Journaling. ? Talking to a trusted person. ? Spending time with friends and family. Safety  Always wear your seat belt while driving or riding in a vehicle.  Do not drive: ? If you have been drinking alcohol. Do not ride with someone who has been drinking. ? When you are tired or distracted. ? While texting.  Wear a helmet and other protective equipment during sports activities.  If you have firearms in your house, make sure you follow all gun safety procedures. What's next?  Visit  your health care provider once a year for an annual wellness visit.  Ask your health care provider how often you should have your eyes and teeth checked.  Stay up to date on all vaccines. This information is not intended to replace advice given to you by your health care provider. Make sure you discuss any questions you have with your health care provider. Document Revised: 01/23/2019 Document Reviewed: 04/20/2018 Elsevier Patient Education  2021 [place].  Preventing High Cholesterol Cholesterol is a white, waxy substance similar to fat that the human body needs to help build cells. The liver makes  all the cholesterol that a person's body needs. Having high cholesterol (hypercholesterolemia) increases your risk for heart disease and stroke. Extra or excess cholesterol comes from the food that you eat. High cholesterol can often be prevented with diet and lifestyle changes. If you already have high cholesterol, you can control it with diet, lifestyle changes, and medicines. How can high cholesterol affect me? If you have high cholesterol, fatty deposits (plaques) may build up on the walls of your blood vessels. The blood vessels that carry blood away from your heart are called arteries. Plaques make the arteries narrower and stiffer. This in turn can:  Restrict or block blood flow and cause blood clots to form.  Increase your risk for heart attack and stroke. What can increase my risk for high cholesterol? This condition is more likely to develop in people who:  Eat foods that are high in saturated fat or cholesterol. Saturated fat is mostly found in foods that come from animal sources.  Are overweight.  Are not getting enough exercise.  Have a family history of high cholesterol (familial hypercholesterolemia). What actions can I take to prevent this? Nutrition  Eat less saturated fat.  Avoid trans fats (partially hydrogenated oils). These are often found in margarine and in  some baked goods, fried foods, and snacks bought in packages.  Avoid precooked or cured meat, such as bacon, sausages, or meat loaves.  Avoid foods and drinks that have added sugars.  Eat more fruits, vegetables, and whole grains.  Choose healthy sources of protein, such as fish, poultry, lean cuts of red meat, beans, peas, lentils, and nuts.  Choose healthy sources of fat, such as: ? Nuts. ? Vegetable oils, especially olive oil. ? Fish that have healthy fats, such as omega-3 fatty acids. These fish include mackerel or salmon.   Lifestyle  Lose weight if you are overweight. Maintaining a healthy body mass index (BMI) can help prevent or control high cholesterol. It can also lower your risk for diabetes and high blood pressure. Ask your health care provider to help you with a diet and exercise plan to lose weight safely.  Do not use any products that contain nicotine or tobacco, such as cigarettes, e-cigarettes, and chewing tobacco. If you need help quitting, ask your health care provider. Alcohol use  Do not drink alcohol if: ? Your health care provider tells you not to drink. ? You are pregnant, may be pregnant, or are planning to become pregnant.  If you drink alcohol: ? Limit how much you use to:  0-1 drink a day for women.  0-2 drinks a day for men. ? Be aware of how much alcohol is in your drink. In the [place] one drink equals one 12 oz bottle of beer (355 mL), one 5 oz glass of wine (148 mL), or one 1 oz glass of hard liquor (44 mL). Activity  Get enough exercise. Do exercises as told by your health care provider.  Each week, do at least 150 minutes of exercise that takes a medium level of effort (moderate-intensity exercise). This kind of exercise: ? Makes your heart beat faster while allowing you to still be able to talk. ? Can be done in short sessions several times a day or longer sessions a few times a week. For example, on 5 days each week, you could walk fast or  ride your bike 3 times a day for 10 minutes each time.   Medicines  Your health care provider may recommend medicines to  help lower cholesterol. This may be a medicine to lower the amount of cholesterol that your liver makes. You may need medicine if: ? Diet and lifestyle changes have not lowered your cholesterol enough. ? You have high cholesterol and other risk factors for heart disease or stroke.  Take over-the-counter and prescription medicines only as told by your health care provider. General information  Manage your risk factors for high cholesterol. Talk with your health care provider about all your risk factors and how to lower your risk.  Manage other conditions that you have, such as diabetes or high blood pressure (hypertension).  Have blood tests to check your cholesterol levels at regular points in time as told by your health care provider.  Keep all follow-up visits as told by your health care provider. This is important. Where to find more information  American Heart Association: www.heart.org  National Heart, Lung, and Blood Institute: https://wilson-eaton.com/ Summary  High cholesterol increases your risk for heart disease and stroke. By keeping your cholesterol level low, you can reduce your risk for these conditions.  High cholesterol can often be prevented with diet and lifestyle changes.  Work with your health care provider to manage your risk factors, and have your blood tested regularly. This information is not intended to replace advice given to you by your health care provider. Make sure you discuss any questions you have with your health care provider. Document Revised: 02/06/2019 Document Reviewed: 02/06/2019 Elsevier Patient Education  [place].

## 2020-10-07 NOTE — Progress Notes (Addendum)
Established Patient Office Visit  Subjective:  Patient ID: Steven Bonilla, male    DOB: 11-Aug-1945  Age: 75 y.o. MRN: [id number]  CC:  Chief Complaint  Patient presents with   Annual Exam    CPE, no concerns, patient fasting for labs.     HPI Steven Bonilla presents for physical exam and follow-up of his elevated LDL.  Doing well no complaints.  He is fasting today.  He is active and denies chest pain shortness of breath or any claudication type pains in his legs.    Past Medical History:  Diagnosis Date   Cataracts, bilateral     Past Surgical History:  Procedure Laterality Date   cataracts  2012   TONSILLECTOMY     age 57     Family History  Problem Relation Age of Onset   Colon polyps Neg Hx    Colon cancer Neg Hx    Esophageal cancer Neg Hx    Rectal cancer Neg Hx    Stomach cancer Neg Hx     Social History   Socioeconomic History   Marital status: Married    Spouse name: Not on file   Number of children: Not on file   Years of education: Not on file   Highest education level: Not on file  Occupational History   Not on file  Tobacco Use   Smoking status: Never Smoker   Smokeless tobacco: Never Used  Vaping Use   Vaping Use: Never used  Substance and Sexual Activity   Alcohol use: Yes    Alcohol/week: 7.0 standard drinks    Types: 7 Glasses of wine per week    Comment: wine daily    Drug use: No   Sexual activity: Not on file  Other Topics Concern   Not on file  Social History Narrative   Not on file   Social Determinants of Health   Financial Resource Strain: Not on file  Food Insecurity: Not on file  Transportation Needs: Not on file  Physical Activity: Not on file  Stress: Not on file  Social Connections: Not on file  Intimate Partner Violence: Not on file    Outpatient Medications Prior to Visit  Medication Sig Dispense Refill   aspirin 81 MG chewable tablet Chew by mouth daily.     Multiple Vitamins-Minerals (MULTIVITAMIN WITH  MINERALS) tablet Take 1 tablet by mouth daily. gummies     Omega-3 Fatty Acids (FISH OIL) 1000 MG CAPS Take by mouth.     tadalafil (CIALIS) 20 MG tablet Take 0.5-1 tablets (10-20 mg total) by mouth every other day as needed for erectile dysfunction. 5 tablet 11   No facility-administered medications prior to visit.    No Known Allergies  ROS Review of Systems  Constitutional: Negative.   HENT: Negative.   Eyes: Negative for photophobia and visual disturbance.  Respiratory: Negative for chest tightness, shortness of breath and wheezing.   Cardiovascular: Negative for chest pain and palpitations.  Gastrointestinal: Negative.  Negative for anal bleeding, blood in stool and constipation.  Endocrine: Negative for polyphagia and polyuria.  Genitourinary: Negative for decreased urine volume, difficulty urinating and frequency.  Musculoskeletal: Negative.   Skin: Negative for color change and pallor.  Neurological: Negative for speech difficulty and weakness.  Hematological: Does not bruise/bleed easily.  Psychiatric/Behavioral: Negative.       Objective:    Physical Exam Vitals and nursing note reviewed.  Constitutional:      General: He is not in acute  distress.    Appearance: Normal appearance. He is not ill-appearing, toxic-appearing or diaphoretic.  HENT:     Head: Normocephalic and atraumatic.     Right Ear: Tympanic membrane, ear canal and external ear normal.     Left Ear: Tympanic membrane, ear canal and external ear normal.     Mouth/Throat:     Mouth: Mucous membranes are moist.     Pharynx: Oropharynx is clear. No oropharyngeal exudate or posterior oropharyngeal erythema.  Eyes:     Extraocular Movements: Extraocular movements intact.     Conjunctiva/sclera: Conjunctivae normal.     Pupils: Pupils are equal, round, and reactive to light.  Neck:     Thyroid: Thyroid mass (asymmetry of thyroid r<l?) present.     Vascular: No carotid bruit.  Cardiovascular:     Rate  and Rhythm: Normal rate and regular rhythm.     Pulses:          Dorsalis pedis pulses are 0 on the right side and 1+ on the left side.       Posterior tibial pulses are 0 on the right side and 0 on the left side.  Pulmonary:     Effort: Pulmonary effort is normal.     Breath sounds: Normal breath sounds.  Abdominal:     General: Abdomen is flat. Bowel sounds are normal. There is no distension.     Palpations: Abdomen is soft.     Tenderness: There is no abdominal tenderness. There is no guarding or rebound.  Genitourinary:    Prostate: Enlarged. Not tender and no nodules present.     Rectum: Guaiac result negative. No mass, tenderness, anal fissure, external hemorrhoid or internal hemorrhoid. Normal anal tone.  Musculoskeletal:     Cervical back: No rigidity or tenderness.     Right lower leg: No edema.     Left lower leg: No edema.  Lymphadenopathy:     Cervical: No cervical adenopathy.  Neurological:     Mental Status: He is alert and oriented to person, place, and time.  Psychiatric:        Mood and Affect: Mood normal.        Behavior: Behavior normal.     BP 122/72   Pulse 63   Temp (!) 97 F (36.1 C) (Temporal)   Ht 5\' 10"  (1.778 m)   Wt 207 lb 9.6 oz (94.2 kg)   SpO2 98%   BMI 29.79 kg/m  Wt Readings from Last 3 Encounters:  10/07/20 207 lb 9.6 oz (94.2 kg)  07/30/20 210 lb (95.3 kg)  03/29/19 211 lb (95.7 kg)     Health Maintenance Due  Topic Date Due   Hepatitis C Screening  Never done   TETANUS/TDAP  Never done   Zoster Vaccines- Shingrix (1 of 2) Never done   PNA vac Low Risk Adult (2 of 2 - PPSV23) 02/21/2019    There are no preventive care reminders to display for this patient.  No results found for: TSH Lab Results  Component Value Date   WBC 5.0 12/07/2018   HGB 15.2 12/07/2018   HCT 44.9 12/07/2018   MCV 99.9 12/07/2018   PLT 240.0 12/07/2018   Lab Results  Component Value Date   NA 138 12/07/2018   K 4.3 12/07/2018   CO2 28  12/07/2018   GLUCOSE 109 (H) 12/07/2018   BUN 13 12/07/2018   CREATININE 1.05 12/07/2018   BILITOT 0.9 12/07/2018   ALKPHOS 37 (L) 12/07/2018  AST 16 12/07/2018   ALT 13 12/07/2018   PROT 6.8 12/07/2018   ALBUMIN 4.3 12/07/2018   CALCIUM 9.2 12/07/2018   GFR 69.25 12/07/2018   Lab Results  Component Value Date   CHOL 221 (H) 12/07/2018   Lab Results  Component Value Date   HDL 56.90 12/07/2018   Lab Results  Component Value Date   LDLCALC 146 (H) 12/07/2018   Lab Results  Component Value Date   TRIG 91.0 12/07/2018   Lab Results  Component Value Date   CHOLHDL 4 12/07/2018   No results found for: HGBA1C    The 10-year ASCVD risk score Mikey Bussing DC Jr., et al., 2013) is: 21.5%   Values used to calculate the score:     Age: 67 years     Sex: Male     Is Non-Hispanic African American: No     Diabetic: No     Tobacco smoker: No     Systolic Blood Pressure: 443 mmHg     Is BP treated: No     HDL Cholesterol: 56.9 mg/dL     Total Cholesterol: 221 mg/dL Assessment & Plan:   Problem List Items Addressed This Visit       Endocrine   Thyroid cyst - Primary   Relevant Orders   TSH   US THYROID     Other   Encounter for health maintenance examination with abnormal findings   Relevant Orders   Comprehensive metabolic panel   PSA   Urinalysis, Routine w reflex microscopic   Elevated LDL cholesterol level   Relevant Orders   CBC   Lipid panel      Other Visit Diagnoses     Decreased pedal pulses       Relevant Orders   ABI (BACK OFFICE)       No orders of the defined types were placed in this encounter.   Follow-up: Return in about 6 months (around 04/08/2021).  Because of his elevated ASCVD risk score recommending a statin.  Patient declines for now.  He would like to lose weight first.  Advised that this would not necessarily lower his risk score but it would lower his LDL cholesterol.  Agrees to go for ABI indexes.  Agrees to go for thyroid  ultrasound.  Spent 40 minutes of total encounter time with this patient discussing his increased risk for a vascular event.  Discussed that while weight loss may help lower his LDL it will probably not bring it down to a safe level.  We also discussed his thyroid mass at length and the possibility that it could be a cancer or benign cyst.  Reminded him that the decreased pulses that I felt on exam may be an indication of existing vascular disease in his body.Libby Maw, MD

## 2020-10-23 ENCOUNTER — Ambulatory Visit (HOSPITAL_BASED_OUTPATIENT_CLINIC_OR_DEPARTMENT_OTHER): Payer: Medicare Other

## 2020-10-28 NOTE — Addendum Note (Signed)
Addended by: Jon Billings on: 10/28/2020 09:22 AM   Modules accepted: Orders

## 2020-10-31 ENCOUNTER — Telehealth: Payer: Self-pay | Admitting: Family Medicine

## 2020-10-31 ENCOUNTER — Telehealth: Payer: Self-pay

## 2020-10-31 NOTE — Telephone Encounter (Signed)
Pt called and wanted a call back about the vas ultrasound put in, he said Dr Ethelene Hal didn't mention anything about it. Callback [phone]

## 2020-10-31 NOTE — Telephone Encounter (Signed)
Patient in the office with concerns about an appointment that he was called for to have ABI preformed. Per patient he had not spoken with doctor regarding this matter and does not feel like this appointment is needed he states that the only appointment he was aware of being scheduled was U/S of thyroid. Please advise, patient would like to speak with Dr. Ethelene Hal if possible.

## 2020-10-31 NOTE — Telephone Encounter (Signed)
Duplicate message. 

## 2020-11-03 NOTE — Telephone Encounter (Signed)
Patient aware of message below and will have appointment scheduled.

## 2020-11-12 ENCOUNTER — Other Ambulatory Visit: Payer: Self-pay

## 2020-11-12 ENCOUNTER — Ambulatory Visit (HOSPITAL_COMMUNITY)
Admission: RE | Admit: 2020-11-12 | Discharge: 2020-11-12 | Disposition: A | Discharge status: Home / Self Care | Payer: Medicare Other | Source: Ambulatory Visit | Attending: Family Medicine | Admitting: Family Medicine

## 2020-11-12 DIAGNOSIS — R0989 Other specified symptoms and signs involving the circulatory and respiratory systems: Secondary | ICD-10-CM | POA: Insufficient documentation

## 2020-11-12 NOTE — Progress Notes (Signed)
ABI exam has been completed.  Results can be found under chart review under CV PROC. 11/12/2020 9:34 AM Tylar Amborn RVT, RDMS

## 2021-01-17 ENCOUNTER — Telehealth: Payer: Self-pay

## 2021-01-17 NOTE — Telephone Encounter (Signed)
LVM for patient to call our office and schedule AWV.

## 2021-04-08 ENCOUNTER — Ambulatory Visit: Payer: Medicare Other | Admitting: Family Medicine

## 2021-04-17 ENCOUNTER — Other Ambulatory Visit: Payer: Self-pay

## 2021-04-20 ENCOUNTER — Encounter: Payer: Self-pay | Admitting: Family Medicine

## 2021-04-20 ENCOUNTER — Other Ambulatory Visit: Payer: Self-pay

## 2021-04-20 ENCOUNTER — Ambulatory Visit (INDEPENDENT_AMBULATORY_CARE_PROVIDER_SITE_OTHER): Payer: Medicare Other | Admitting: Family Medicine

## 2021-04-20 VITALS — BP 116/72 | HR 61 | Temp 96.7°F | Ht 70.0 in | Wt 214.0 lb

## 2021-04-20 DIAGNOSIS — R0989 Other specified symptoms and signs involving the circulatory and respiratory systems: Secondary | ICD-10-CM | POA: Insufficient documentation

## 2021-04-20 DIAGNOSIS — E7800 Pure hypercholesterolemia, unspecified: Secondary | ICD-10-CM

## 2021-04-20 DIAGNOSIS — N529 Male erectile dysfunction, unspecified: Secondary | ICD-10-CM | POA: Diagnosis not present

## 2021-04-20 DIAGNOSIS — R7309 Other abnormal glucose: Secondary | ICD-10-CM | POA: Insufficient documentation

## 2021-04-20 LAB — COMPREHENSIVE METABOLIC PANEL
ALT: 10 U/L (ref 0–53)
AST: 14 U/L (ref 0–37)
Albumin: 4.1 g/dL (ref 3.5–5.2)
Alkaline Phosphatase: 41 U/L (ref 39–117)
BUN: 15 mg/dL (ref 6–23)
CO2: 29 mEq/L (ref 19–32)
Calcium: 9.2 mg/dL (ref 8.4–10.5)
Chloride: 103 mEq/L (ref 96–112)
Creatinine, Ser: 1.05 mg/dL (ref 0.40–1.50)
GFR: 69.55 mL/min (ref >60.00)
Glucose, Bld: 101 mg/dL — ABNORMAL HIGH (ref 70–99)
Potassium: 4.2 mEq/L (ref 3.5–5.1)
Sodium: 139 mEq/L (ref 135–145)
Total Bilirubin: 0.9 mg/dL (ref 0.2–1.2)
Total Protein: 6.6 g/dL (ref 6.0–8.3)

## 2021-04-20 LAB — LIPID PANEL
Cholesterol: 235 mg/dL — ABNORMAL HIGH (ref 0–200)
HDL: 60.9 mg/dL (ref >39.00)
LDL Cholesterol: 149 mg/dL — ABNORMAL HIGH (ref 0–99)
NonHDL: 174.01
Total CHOL/HDL Ratio: 4
Triglycerides: 126 mg/dL (ref 0.0–149.0)
VLDL: 25.2 mg/dL (ref 0.0–40.0)

## 2021-04-20 LAB — HEMOGLOBIN A1C: Hgb A1c MFr Bld: 6 % (ref 4.6–6.5)

## 2021-04-20 MED ORDER — TADALAFIL 20 MG PO TABS: 10.0000 mg | ORAL_TABLET | ORAL | 11 refills | Status: AC | PRN

## 2021-04-20 NOTE — Progress Notes (Signed)
Established Patient Office Visit  Subjective:  Patient ID: Steven Bonilla, male    DOB: [birth year]  Age: 75 y.o. MRN: [id number]  CC:  Chief Complaint  Patient presents with   Follow-up    6 month follow up, no concerns. Patient fasting.     HPI Steven Bonilla presents for follow-up of elevated ldl cholesterol with elevated ASCVD risk score, diminished pedal pulses and ED believed to be vasculogenic.  Patient does not smoke.  He is fasting for repeat lipid profile.  He would like some dietary guidance.  Never made it for the ABIs.  Past Medical History:  Diagnosis Date   Cataracts, bilateral     Past Surgical History:  Procedure Laterality Date   cataracts  2012   TONSILLECTOMY     age 72     Family History  Problem Relation Age of Onset   Colon polyps Neg Hx    Colon cancer Neg Hx    Esophageal cancer Neg Hx    Rectal cancer Neg Hx    Stomach cancer Neg Hx     Social History   Socioeconomic History   Marital status: Married    Spouse name: Not on file   Number of children: Not on file   Years of education: Not on file   Highest education level: Not on file  Occupational History   Not on file  Tobacco Use   Smoking status: Never   Smokeless tobacco: Never  Vaping Use   Vaping Use: Never used  Substance and Sexual Activity   Alcohol use: Yes    Alcohol/week: 7.0 standard drinks    Types: 7 Glasses of wine per week    Comment: wine daily    Drug use: No   Sexual activity: Not on file  Other Topics Concern   Not on file  Social History Narrative   Not on file   Social Determinants of Health   Financial Resource Strain: Not on file  Food Insecurity: Not on file  Transportation Needs: Not on file  Physical Activity: Not on file  Stress: Not on file  Social Connections: Not on file  Intimate Partner Violence: Not on file    Outpatient Medications Prior to Visit  Medication Sig Dispense Refill   aspirin 81 MG chewable tablet Chew by mouth daily.      Multiple Vitamins-Minerals (MULTIVITAMIN WITH MINERALS) tablet Take 1 tablet by mouth daily. gummies     Omega-3 Fatty Acids (FISH OIL) 1000 MG CAPS Take by mouth.     tadalafil (CIALIS) 20 MG tablet Take 0.5-1 tablets (10-20 mg total) by mouth every other day as needed for erectile dysfunction. 5 tablet 11   No facility-administered medications prior to visit.    No Known Allergies  ROS Review of Systems  Constitutional:  Negative for diaphoresis, fatigue, fever and unexpected weight change.  HENT: Negative.    Eyes:  Negative for photophobia.  Respiratory: Negative.    Cardiovascular: Negative.   Gastrointestinal: Negative.   Endocrine: Negative for polyphagia and polyuria.  Genitourinary: Negative.   Musculoskeletal:  Negative for gait problem and joint swelling.  Neurological:  Negative for speech difficulty and weakness.  Psychiatric/Behavioral: Negative.       Objective:    Physical Exam Vitals and nursing note reviewed.  Constitutional:      General: He is not in acute distress.    Appearance: Normal appearance. He is not ill-appearing, toxic-appearing or diaphoretic.  HENT:     Head: Normocephalic  and atraumatic.     Right Ear: External ear normal.     Left Ear: External ear normal.  Eyes:     General: No scleral icterus.       Right eye: No discharge.        Left eye: No discharge.     Extraocular Movements: Extraocular movements intact.     Conjunctiva/sclera: Conjunctivae normal.  Neck:     Vascular: No carotid bruit.  Cardiovascular:     Rate and Rhythm: Normal rate and regular rhythm.     Pulses:          Dorsalis pedis pulses are 0 on the right side and 1+ on the left side.       Posterior tibial pulses are 1+ on the right side and 0 on the left side.  Pulmonary:     Effort: Pulmonary effort is normal.     Breath sounds: Normal breath sounds.  Abdominal:     General: Bowel sounds are normal.  Musculoskeletal:     Cervical back: No rigidity or  tenderness.     Right lower leg: No edema.     Left lower leg: No edema.  Lymphadenopathy:     Cervical: No cervical adenopathy.  Skin:    General: Skin is warm and dry.  Neurological:     Mental Status: He is alert.  Psychiatric:        Behavior: Behavior normal.    BP 116/72 (BP Location: Left Arm, Patient Position: Sitting, Cuff Size: Normal)   Pulse 61   Temp (!) 96.7 F (35.9 C) (Temporal)   Ht 5\' 10"  (1.778 m)   Wt 214 lb (97.1 kg)   SpO2 98%   BMI 30.71 kg/m  Wt Readings from Last 3 Encounters:  04/20/21 214 lb (97.1 kg)  10/07/20 207 lb 9.6 oz (94.2 kg)  07/30/20 210 lb (95.3 kg)     Health Maintenance Due  Topic Date Due   Hepatitis C Screening  Never done   TETANUS/TDAP  Never done   Zoster Vaccines- Shingrix (1 of 2) Never done   Pneumonia Vaccine 18+ Years old (2 - PPSV23 if available, else PCV20) 02/21/2019    There are no preventive care reminders to display for this patient.  Lab Results  Component Value Date   TSH 0.85 10/07/2020   Lab Results  Component Value Date   WBC 4.9 10/07/2020   HGB 15.6 10/07/2020   HCT 46.4 10/07/2020   MCV 100.3 (H) 10/07/2020   PLT 265.0 10/07/2020   Lab Results  Component Value Date   NA 138 10/07/2020   K 4.2 10/07/2020   CO2 28 10/07/2020   GLUCOSE 103 (H) 10/07/2020   BUN 11 10/07/2020   CREATININE 1.11 10/07/2020   BILITOT 0.9 10/07/2020   ALKPHOS 49 10/07/2020   AST 16 10/07/2020   ALT 13 10/07/2020   PROT 6.8 10/07/2020   ALBUMIN 4.2 10/07/2020   CALCIUM 9.2 10/07/2020   GFR 65.30 10/07/2020   Lab Results  Component Value Date   CHOL 246 (H) 10/07/2020   Lab Results  Component Value Date   HDL 65.00 10/07/2020   Lab Results  Component Value Date   LDLCALC 165 (H) 10/07/2020   Lab Results  Component Value Date   TRIG 81.0 10/07/2020   Lab Results  Component Value Date   CHOLHDL 4 10/07/2020   No results found for: HGBA1C The 10-year ASCVD risk score (Arnett DK, et al., 2019)  is:  20.9%   Values used to calculate the score:     Age: 59 years     Sex: Male     Is Non-Hispanic African American: No     Diabetic: No     Tobacco smoker: No     Systolic Blood Pressure: 741 mmHg     Is BP treated: No     HDL Cholesterol: 65 mg/dL     Total Cholesterol: 246 mg/dL    Assessment & Plan:   Problem List Items Addressed This Visit       Other   Vasculogenic erectile dysfunction   Relevant Medications   tadalafil (CIALIS) 20 MG tablet   Elevated LDL cholesterol level   Relevant Orders   Lipid panel   Comprehensive metabolic panel   Decreased pedal pulses - Primary   Relevant Orders   VAS [place] ABI WITH/WO TBI   Elevated glucose   Relevant Orders   Comprehensive metabolic panel   Hemoglobin A1c    Meds ordered this encounter  Medications   tadalafil (CIALIS) 20 MG tablet    Sig: Take 0.5-1 tablets (10-20 mg total) by mouth every other day as needed for erectile dysfunction.    Dispense:  5 tablet    Refill:  11    Follow-up: Return in about 3 months (around 07/19/2021).  Given information on preventing hypercholesterolemia.  Again discussed statin.  Says that he will go for ABIs.  Advised the Shingrix vaccine.  Libby Maw, MD

## 2021-04-23 ENCOUNTER — Encounter: Payer: Self-pay | Admitting: Family Medicine

## 2021-04-23 ENCOUNTER — Other Ambulatory Visit: Payer: Self-pay

## 2021-04-23 ENCOUNTER — Ambulatory Visit (INDEPENDENT_AMBULATORY_CARE_PROVIDER_SITE_OTHER): Payer: Medicare Other | Admitting: Family Medicine

## 2021-04-23 VITALS — BP 122/74 | HR 70 | Temp 97.4°F | Ht 70.0 in | Wt 213.4 lb

## 2021-04-23 DIAGNOSIS — N5089 Other specified disorders of the male genital organs: Secondary | ICD-10-CM

## 2021-04-23 DIAGNOSIS — E7800 Pure hypercholesterolemia, unspecified: Secondary | ICD-10-CM | POA: Diagnosis not present

## 2021-04-23 NOTE — Progress Notes (Signed)
Established Patient Office Visit  Subjective:  Patient ID: Steven Bonilla, male    DOB: 09-18-1945  Age: 75 y.o. MRN: [id number]  CC:  Chief Complaint  Patient presents with   Follow-up    Pt c/o abnormal growth on right testicle with no pain or swelling.     HPI Lawernce Earll presents for follow-up of elevated ASCVD risk score high normal hemoglobin A1c and concern about a possible testicular mass.  He is not certain how long the mass on his right testicle has been present.  Thinks it may have changed some.  It is nontender.  He notices it in the shower a few months ago.  He does not remember having this is a younger person.  Past Medical History:  Diagnosis Date   Cataracts, bilateral     Past Surgical History:  Procedure Laterality Date   cataracts  2012   TONSILLECTOMY     age 78     Family History  Problem Relation Age of Onset   Colon polyps Neg Hx    Colon cancer Neg Hx    Esophageal cancer Neg Hx    Rectal cancer Neg Hx    Stomach cancer Neg Hx     Social History   Socioeconomic History   Marital status: Married    Spouse name: Not on file   Number of children: Not on file   Years of education: Not on file   Highest education level: Not on file  Occupational History   Not on file  Tobacco Use   Smoking status: Never   Smokeless tobacco: Never  Vaping Use   Vaping Use: Never used  Substance and Sexual Activity   Alcohol use: Yes    Alcohol/week: 7.0 standard drinks    Types: 7 Glasses of wine per week    Comment: wine daily    Drug use: No   Sexual activity: Not on file  Other Topics Concern   Not on file  Social History Narrative   Not on file   Social Determinants of Health   Financial Resource Strain: Not on file  Food Insecurity: Not on file  Transportation Needs: Not on file  Physical Activity: Not on file  Stress: Not on file  Social Connections: Not on file  Intimate Partner Violence: Not on file    Outpatient Medications  Prior to Visit  Medication Sig Dispense Refill   aspirin 81 MG chewable tablet Chew by mouth daily.     Multiple Vitamins-Minerals (MULTIVITAMIN WITH MINERALS) tablet Take 1 tablet by mouth daily. gummies     Omega-3 Fatty Acids (FISH OIL) 1000 MG CAPS Take by mouth.     tadalafil (CIALIS) 20 MG tablet Take 0.5-1 tablets (10-20 mg total) by mouth every other day as needed for erectile dysfunction. 5 tablet 11   No facility-administered medications prior to visit.    No Known Allergies  ROS Review of Systems  Constitutional:  Negative for diaphoresis, fatigue, fever and unexpected weight change.  HENT: Negative.    Eyes:  Negative for photophobia.  Respiratory: Negative.    Cardiovascular:  Negative for chest pain and palpitations.  Gastrointestinal: Negative.   Genitourinary:  Negative for difficulty urinating, frequency and urgency.  Musculoskeletal:  Negative for gait problem and joint swelling.  Neurological:  Negative for speech difficulty.  Psychiatric/Behavioral: Negative.       Objective:    Physical Exam Vitals and nursing note reviewed.  Constitutional:      General: He  is not in acute distress.    Appearance: Normal appearance. He is not ill-appearing, toxic-appearing or diaphoretic.  HENT:     Head: Normocephalic and atraumatic.     Right Ear: External ear normal.     Left Ear: External ear normal.     Mouth/Throat:     Pharynx: No oropharyngeal exudate or posterior oropharyngeal erythema.  Eyes:     General: No scleral icterus.       Right eye: No discharge.        Left eye: No discharge.     Extraocular Movements: Extraocular movements intact.     Conjunctiva/sclera: Conjunctivae normal.  Pulmonary:     Effort: Pulmonary effort is normal.  Abdominal:     Hernia: There is no hernia in the left inguinal area or right inguinal area.  Genitourinary:    Penis: No hypospadias, erythema, tenderness, discharge, swelling or lesions.      Testes:        Right:  Mass present. Tenderness or swelling not present. Right testis is descended.        Left: Mass, tenderness or swelling not present. Left testis is descended.     Epididymis:     Right: Not inflamed or enlarged.     Left: Not inflamed or enlarged.  Lymphadenopathy:     Cervical: No cervical adenopathy.     Lower Body: No right inguinal adenopathy. No left inguinal adenopathy.  Skin:    General: Skin is warm and dry.  Neurological:     Mental Status: He is alert and oriented to person, place, and time.  Psychiatric:        Mood and Affect: Mood normal.        Behavior: Behavior normal.    BP 122/74 (BP Location: Left Arm, Patient Position: Sitting, Cuff Size: Normal)    Pulse 70    Temp (!) 97.4 F (36.3 C) (Temporal)    Ht 5\' 10"  (1.778 m)    Wt 213 lb 6.4 oz (96.8 kg)    SpO2 96%    BMI 30.62 kg/m  Wt Readings from Last 3 Encounters:  04/23/21 213 lb 6.4 oz (96.8 kg)  04/20/21 214 lb (97.1 kg)  10/07/20 207 lb 9.6 oz (94.2 kg)     Health Maintenance Due  Topic Date Due   Hepatitis C Screening  Never done   TETANUS/TDAP  Never done   Zoster Vaccines- Shingrix (1 of 2) Never done   Pneumonia Vaccine 19+ Years old (2 - PPSV23 if available, else PCV20) 02/21/2019    There are no preventive care reminders to display for this patient.  Lab Results  Component Value Date   TSH 0.85 10/07/2020   Lab Results  Component Value Date   WBC 4.9 10/07/2020   HGB 15.6 10/07/2020   HCT 46.4 10/07/2020   MCV 100.3 (H) 10/07/2020   PLT 265.0 10/07/2020   Lab Results  Component Value Date   NA 139 04/20/2021   K 4.2 04/20/2021   CO2 29 04/20/2021   GLUCOSE 101 (H) 04/20/2021   BUN 15 04/20/2021   CREATININE 1.05 04/20/2021   BILITOT 0.9 04/20/2021   ALKPHOS 41 04/20/2021   AST 14 04/20/2021   ALT 10 04/20/2021   PROT 6.6 04/20/2021   ALBUMIN 4.1 04/20/2021   CALCIUM 9.2 04/20/2021   GFR 69.55 04/20/2021   Lab Results  Component Value Date   CHOL 235 (H) 04/20/2021    Lab Results  Component Value Date  HDL 60.90 04/20/2021   Lab Results  Component Value Date   LDLCALC 149 (H) 04/20/2021   Lab Results  Component Value Date   TRIG 126.0 04/20/2021   Lab Results  Component Value Date   CHOLHDL 4 04/20/2021   Lab Results  Component Value Date   HGBA1C 6.0 04/20/2021      Assessment & Plan:   Problem List Items Addressed This Visit       Other   Elevated LDL cholesterol level - Primary   Testicular mass   Relevant Orders   US Scrotum    No orders of the defined types were placed in this encounter.   Follow-up: Return in about 3 months (around 07/22/2021).  Again advised statin.  We discussed that statins have been associated with unmasking 80s.  His hemoglobin A1c is high normal.  Hopefully the lesion on his right testicle has a testicular appendage.  We will send for ultrasound to confirm.  Agrees to go for ABIs for evaluation decreased.  Pedal pulses.  Libby Maw, MD

## 2021-04-29 ENCOUNTER — Ambulatory Visit: Payer: Medicare Other

## 2021-04-29 ENCOUNTER — Other Ambulatory Visit: Payer: Self-pay

## 2021-04-29 NOTE — Progress Notes (Deleted)
Per the orders of Dr. Ethelene Hal pt is here for 1st dose of Shingles two series vaccine, pt received vaccine in pt tolerated vaccine well.

## 2021-04-30 ENCOUNTER — Ambulatory Visit
Admission: RE | Admit: 2021-04-30 | Discharge: 2021-04-30 | Disposition: A | Discharge status: Home / Self Care | Payer: Medicare Other | Source: Ambulatory Visit | Attending: Family Medicine | Admitting: Family Medicine

## 2021-04-30 DIAGNOSIS — N5089 Other specified disorders of the male genital organs: Secondary | ICD-10-CM

## 2021-05-12 ENCOUNTER — Other Ambulatory Visit: Payer: Medicare Other

## 2021-05-22 ENCOUNTER — Encounter (HOSPITAL_COMMUNITY): Payer: Self-pay

## 2021-05-22 ENCOUNTER — Other Ambulatory Visit: Payer: Self-pay

## 2021-05-22 ENCOUNTER — Ambulatory Visit (HOSPITAL_COMMUNITY)
Admission: RE | Admit: 2021-05-22 | Discharge: 2021-05-22 | Disposition: A | Discharge status: Home / Self Care | Payer: Medicare Other | Source: Ambulatory Visit | Attending: Family Medicine | Admitting: Family Medicine

## 2021-05-22 DIAGNOSIS — R0989 Other specified symptoms and signs involving the circulatory and respiratory systems: Secondary | ICD-10-CM

## 2021-05-22 NOTE — Progress Notes (Signed)
ABI w/ TBI not performed. Patient with ABI study performed on 11-12-2020 for same indication. Test was normal at that time. Patient educated about arterial symptoms; patient states he has no symptoms at this time. Patient elected not to go forward with ABI testing at this time. Instructed patient to follow up with PCP if he experiences any new symptoms.  Darlin Coco, RDMS, RVT.

## 2021-07-20 ENCOUNTER — Ambulatory Visit: Payer: Medicare Other | Admitting: Family Medicine

## 2021-08-18 ENCOUNTER — Ambulatory Visit (INDEPENDENT_AMBULATORY_CARE_PROVIDER_SITE_OTHER): Payer: Medicare Other

## 2021-08-18 DIAGNOSIS — Z0000 Encounter for general adult medical examination without abnormal findings: Secondary | ICD-10-CM | POA: Diagnosis not present

## 2021-08-18 NOTE — Progress Notes (Signed)
? ?Subjective:  ? Steven Bonilla is a 76 y.o. male who presents for an Initial Medicare Annual Wellness Visit. ? ?I connected with Debbe Bales today by telephone and verified that I am speaking with the correct person using two identifiers. ?Location patient: home ?Location provider: work ?Persons participating in the virtual visit: patient, provider. ?  ?I discussed the limitations, risks, security and privacy concerns of performing an evaluation and management service by telephone and the availability of in person appointments. I also discussed with the patient that there may be a patient responsible charge related to this service. The patient expressed understanding and verbally consented to this telephonic visit.  ?  ?Interactive audio and video telecommunications were attempted between this provider and patient, however failed, due to patient having technical difficulties OR patient did not have access to video capability.  We continued and completed visit with audio only. ? ?  ?Review of Systems    ? ?Cardiac Risk Factors include: advanced age (>42mn, >>67women);male gender ? ?   ?Objective:  ?  ?Today's Vitals  ? ?There is no height or weight on file to calculate BMI. ? ? ?  08/18/2021  ?  9:51 AM  ?Advanced Directives  ?Does Patient Have a Medical Advance Directive? Yes  ?Type of [place]  ?Copy of [place] Chart? No - copy requested  ? ? ?Current Medications (verified) ?Outpatient Encounter Medications as of 08/18/2021  ?Medication Sig  ? aspirin 81 MG chewable tablet Chew by mouth daily.  ? Multiple Vitamins-Minerals (MULTIVITAMIN WITH MINERALS) tablet Take 1 tablet by mouth daily. gummies  ? Omega-3 Fatty Acids (FISH OIL) 1000 MG CAPS Take by mouth.  ? tadalafil (CIALIS) 20 MG tablet Take 0.5-1 tablets (10-20 mg total) by mouth every other day as needed for erectile dysfunction.  ? ?No facility-administered encounter medications  on file as of 08/18/2021.  ? ? ?Allergies (verified) ?Patient has no known allergies.  ? ?History: ?Past Medical History:  ?Diagnosis Date  ? Cataracts, bilateral   ? ?Past Surgical History:  ?Procedure Laterality Date  ? cataracts  2012  ? TONSILLECTOMY    ? age 76  ? ?Family History  ?Problem Relation Age of Onset  ? Colon polyps Neg Hx   ? Colon cancer Neg Hx   ? Esophageal cancer Neg Hx   ? Rectal cancer Neg Hx   ? Stomach cancer Neg Hx   ? ?Social History  ? ?Socioeconomic History  ? Marital status: Married  ?  Spouse name: Not on file  ? Number of children: Not on file  ? Years of education: Not on file  ? Highest education level: Not on file  ?Occupational History  ? Not on file  ?Tobacco Use  ? Smoking status: Never  ? Smokeless tobacco: Never  ?Vaping Use  ? Vaping Use: Never used  ?Substance and Sexual Activity  ? Alcohol use: Yes  ?  Alcohol/week: 7.0 standard drinks  ?  Types: 7 Glasses of wine per week  ?  Comment: wine daily   ? Drug use: No  ? Sexual activity: Not on file  ?Other Topics Concern  ? Not on file  ?Social History Narrative  ? Not on file  ? ?Social Determinants of Health  ? ?Financial Resource Strain: Low Risk   ? Difficulty of Paying Living Expenses: Not hard at all  ?Food Insecurity: No Food Insecurity  ? Worried About [place] [employer]  Food in the Last Year: Never true  ? Ran Out of Food in the Last Year: Never true  ?Transportation Needs: No Transportation Needs  ? Lack of Transportation (Medical): No  ? Lack of Transportation (Non-Medical): No  ?Physical Activity: Insufficiently Active  ? Days of Exercise per Week: 4 days  ? Minutes of Exercise per Session: 30 min  ?Stress: No Stress Concern Present  ? Feeling of Stress : Not at all  ?Social Connections: Socially Integrated  ? Frequency of Communication with Friends and Family: Twice a week  ? Frequency of Social Gatherings with Friends and Family: Twice a week  ? Attends Religious Services: 1 to 4 times per year  ? Active Member of  Clubs or Organizations: Yes  ? Attends Archivist Meetings: 1 to 4 times per year  ? Marital Status: Married  ? ? ?Tobacco Counseling ?Counseling given: Not Answered ? ? ?Clinical Intake: ? ?Pre-visit preparation completed: Yes ? ?Pain : No/denies pain ? ?  ? ?Nutritional Risks: None ?Diabetes: No ? ?How often do you need to have someone help you when you read instructions, pamphlets, or other written materials from your doctor or pharmacy?: 1 - Never ?What is the last grade level you completed in school?: college ? ?Diabetic?no  ? ?Interpreter Needed?: No ? ?Information entered by :: [email or web address] ? ? ?Activities of Daily Living ? ?  08/18/2021  ?  9:53 AM  ?In your present state of health, do you have any difficulty performing the following activities:  ?Hearing? 0  ?Vision? 0  ?Difficulty concentrating or making decisions? 0  ?Walking or climbing stairs? 0  ?Dressing or bathing? 0  ?Doing errands, shopping? 0  ?Preparing Food and eating ? N  ?Using the Toilet? N  ?In the past six months, have you accidently leaked urine? N  ?Do you have problems with loss of bowel control? N  ?Managing your Medications? N  ?Managing your Finances? N  ?Housekeeping or managing your Housekeeping? N  ? ? ?Patient Care Team: ?Libby Maw, MD as PCP - General (Family Medicine) ? ?Indicate any recent Medical Services you may have received from other than [facility] providers in the past year (date may be approximate). ? ?   ?Assessment:  ? This is a routine wellness examination for Steven Bonilla. ? ?Hearing/Vision screen ?Vision Screening - Comments:: Annual eye exams wears glasses  ? ?Dietary issues and exercise activities discussed: ?Current Exercise Habits: Home exercise routine, Type of exercise: walking, Time (Minutes): 30, Frequency (Times/Week): 3, Weekly Exercise (Minutes/Week): 90, Intensity: Mild, Exercise limited by: None identified ? ? Goals Addressed   ?None ?  ? ?Depression Screen ? ?  08/18/2021  ?  9:51 AM  08/18/2021  ?  9:49 AM 04/20/2021  ?  9:21 AM 10/07/2020  ?  1:25 PM 10/07/2020  ? 10:13 AM 07/30/2020  ? 10:42 AM  ?PHQ 2/9 Scores  ?PHQ - 2 Score 0 0 0 0 0 0  ?PHQ- 9 Score    0    ?  ?Fall Risk ? ?  08/18/2021  ?  9:51 AM 04/20/2021  ?  9:21 AM 10/07/2020  ? 10:13 AM  ?Fall Risk   ?Falls in the past year? 0 0 0  ?Number falls in past yr: 0 0   ?Injury with Fall? 0    ?Follow up Falls evaluation completed    ? ? ?FALL RISK PREVENTION PERTAINING TO THE HOME: ? ?Any stairs in or around the home? Yes  ?  If so, are there any without handrails? No  ?Home free of loose throw rugs in walkways, pet beds, electrical cords, etc? Yes  ?Adequate lighting in your home to reduce risk of falls? Yes  ? ?ASSISTIVE DEVICES UTILIZED TO PREVENT FALLS: ? ?Life alert? No  ?Use of a cane, walker or w/c? No  ?Grab bars in the bathroom? No  ?Shower chair or bench in shower? No  ?Elevated toilet seat or a handicapped toilet? No  ? ? ?Cognitive Function: ? Normal cognitive status assessed by telephone conversation  by this Nurse Health Advisor. No abnormalities found.  ? ?  ?  ? ?Immunizations ?Immunization History  ?Administered Date(s) Administered  ? DTP 11/09/2010  ? Influenza, High Dose Seasonal PF 02/20/2018, 03/06/2019  ? Influenza-Unspecified 02/27/2020, 02/28/2021  ? PFIZER(Purple Top)SARS-COV-2 Vaccination 06/17/2019, 07/12/2019, 01/29/2020  ? Pneumococcal Conjugate-13 02/20/2018  ? ? ?TDAP status: Due, Education has been provided regarding the importance of this vaccine. Advised may receive this vaccine at local pharmacy or Health Dept. Aware to provide a copy of the vaccination record if obtained from local pharmacy or Health Dept. Verbalized acceptance and understanding. ? ?Flu Vaccine status: Up to date ? ?Pneumococcal vaccine status: Up to date ? ?Covid-19 vaccine status: Completed vaccines ? ?Qualifies for Shingles Vaccine? Yes   ?Zostavax completed No   ?Shingrix Completed?: No.    Education has been provided regarding the  importance of this vaccine. Patient has been advised to call insurance company to determine out of pocket expense if they have not yet received this vaccine. Advised may also receive vaccine at local pharmacy or Health

## 2021-08-18 NOTE — Patient Instructions (Signed)
Steven Bonilla , ?Thank you for taking time to come for your Medicare Wellness Visit. I appreciate your ongoing commitment to your health goals. Please review the following plan we discussed and let me know if I can assist you in the future.  ? ?Screening recommendations/referrals: ?Colonoscopy: no longer required  ?Recommended yearly ophthalmology/optometry visit for glaucoma screening and checkup ?Recommended yearly dental visit for hygiene and checkup ? ?Vaccinations: ?Influenza vaccine: completed  ?Pneumococcal vaccine: completed  ?Tdap vaccine: due  ?Shingles vaccine: will consider    ? ?Advanced directives: yes  ? ?Conditions/risks identified: none  ? ?Next appointment: none  ? ?Preventive Care 76 Years and Older, Male ?Preventive care refers to lifestyle choices and visits with your health care provider that can promote health and wellness. ?What does preventive care include? ?A yearly physical exam. This is also called an annual well check. ?Dental exams once or twice a year. ?Routine eye exams. Ask your health care provider how often you should have your eyes checked. ?Personal lifestyle choices, including: ?Daily care of your teeth and gums. ?Regular physical activity. ?Eating a healthy diet. ?Avoiding tobacco and drug use. ?Limiting alcohol use. ?Practicing safe sex. ?Taking low doses of aspirin every day. ?Taking vitamin and mineral supplements as recommended by your health care provider. ?What happens during an annual well check? ?The services and screenings done by your health care provider during your annual well check will depend on your age, overall health, lifestyle risk factors, and family history of disease. ?Counseling  ?Your health care provider may ask you questions about your: ?Alcohol use. ?Tobacco use. ?Drug use. ?Emotional well-being. ?Home and relationship well-being. ?Sexual activity. ?Eating habits. ?History of falls. ?Memory and ability to understand (cognition). ?Work and work  Statistician. ?Screening  ?You may have the following tests or measurements: ?Height, weight, and BMI. ?Blood pressure. ?Lipid and cholesterol levels. These may be checked every 5 years, or more frequently if you are over 76 years old. ?Skin check. ?Lung cancer screening. You may have this screening every year starting at age 76 if you have a 30-pack-year history of smoking and currently smoke or have quit within the past 15 years. ?Fecal occult blood test (FOBT) of the stool. You may have this test every year starting at age 76. ?Flexible sigmoidoscopy or colonoscopy. You may have a sigmoidoscopy every 5 years or a colonoscopy every 10 years starting at age 78. ?Prostate cancer screening. Recommendations will vary depending on your family history and other risks. ?Hepatitis C blood test. ?Hepatitis B blood test. ?Sexually transmitted disease (STD) testing. ?Diabetes screening. This is done by checking your blood sugar (glucose) after you have not eaten for a while (fasting). You may have this done every 1-3 years. ?Abdominal aortic aneurysm (AAA) screening. You may need this if you are a current or former smoker. ?Osteoporosis. You may be screened starting at age 76 if you are at high risk. ?Talk with your health care provider about your test results, treatment options, and if necessary, the need for more tests. ?Vaccines  ?Your health care provider may recommend certain vaccines, such as: ?Influenza vaccine. This is recommended every year. ?Tetanus, diphtheria, and acellular pertussis (Tdap, Td) vaccine. You may need a Td booster every 10 years. ?Zoster vaccine. You may need this after age 76. ?Pneumococcal 13-valent conjugate (PCV13) vaccine. One dose is recommended after age 76. ?Pneumococcal polysaccharide (PPSV23) vaccine. One dose is recommended after age 76. ?Talk to your health care provider about which screenings and vaccines you need  and how often you need them. ?This information is not intended to replace  advice given to you by your health care provider. Make sure you discuss any questions you have with your health care provider. ?Document Released: 05/23/2015 Document Revised: 01/14/2016 Document Reviewed: 02/25/2015 ?Elsevier Interactive Patient Education ? 2017 [employer]. ? ?Fall Prevention in the Home ?Falls can cause injuries. They can happen to people of all ages. There are many things you can do to make your home safe and to help prevent falls. ?What can I do on the outside of my home? ?Regularly fix the edges of walkways and driveways and fix any cracks. ?Remove anything that might make you trip as you walk through a door, such as a raised step or threshold. ?Trim any bushes or trees on the path to your home. ?Use bright outdoor lighting. ?Clear any walking paths of anything that might make someone trip, such as rocks or tools. ?Regularly check to see if handrails are loose or broken. Make sure that both sides of any steps have handrails. ?Any raised decks and porches should have guardrails on the edges. ?Have any leaves, snow, or ice cleared regularly. ?Use sand or salt on walking paths during winter. ?Clean up any spills in your garage right away. This includes oil or grease spills. ?What can I do in the bathroom? ?Use night lights. ?Install grab bars by the toilet and in the tub and shower. Do not use towel bars as grab bars. ?Use non-skid mats or decals in the tub or shower. ?If you need to sit down in the shower, use a plastic, non-slip stool. ?Keep the floor dry. Clean up any water that spills on the floor as soon as it happens. ?Remove soap buildup in the tub or shower regularly. ?Attach bath mats securely with double-sided non-slip rug tape. ?Do not have throw rugs and other things on the floor that can make you trip. ?What can I do in the bedroom? ?Use night lights. ?Make sure that you have a light by your bed that is easy to reach. ?Do not use any sheets or blankets that are too big for your bed.  They should not hang down onto the floor. ?Have a firm chair that has side arms. You can use this for support while you get dressed. ?Do not have throw rugs and other things on the floor that can make you trip. ?What can I do in the kitchen? ?Clean up any spills right away. ?Avoid walking on wet floors. ?Keep items that you use a lot in easy-to-reach places. ?If you need to reach something above you, use a strong step stool that has a grab bar. ?Keep electrical cords out of the way. ?Do not use floor polish or wax that makes floors slippery. If you must use wax, use non-skid floor wax. ?Do not have throw rugs and other things on the floor that can make you trip. ?What can I do with my stairs? ?Do not leave any items on the stairs. ?Make sure that there are handrails on both sides of the stairs and use them. Fix handrails that are broken or loose. Make sure that handrails are as long as the stairways. ?Check any carpeting to make sure that it is firmly attached to the stairs. Fix any carpet that is loose or worn. ?Avoid having throw rugs at the top or bottom of the stairs. If you do have throw rugs, attach them to the floor with carpet tape. ?Make sure that you  have a light switch at the top of the stairs and the bottom of the stairs. If you do not have them, ask someone to add them for you. ?What else can I do to help prevent falls? ?Wear shoes that: ?Do not have high heels. ?Have rubber bottoms. ?Are comfortable and fit you well. ?Are closed at the toe. Do not wear sandals. ?If you use a stepladder: ?Make sure that it is fully opened. Do not climb a closed stepladder. ?Make sure that both sides of the stepladder are locked into place. ?Ask someone to hold it for you, if possible. ?Clearly mark and make sure that you can see: ?Any grab bars or handrails. ?First and last steps. ?Where the edge of each step is. ?Use tools that help you move around (mobility aids) if they are needed. These  include: ?Canes. ?Walkers. ?Scooters. ?Crutches. ?Turn on the lights when you go into a dark area. Replace any light bulbs as soon as they burn out. ?Set up your furniture so you have a clear path. Avoid moving your furniture around. ?If

## 2021-12-14 ENCOUNTER — Ambulatory Visit (INDEPENDENT_AMBULATORY_CARE_PROVIDER_SITE_OTHER): Payer: Medicare Other | Admitting: Family Medicine

## 2021-12-14 ENCOUNTER — Encounter: Payer: Self-pay | Admitting: Family Medicine

## 2021-12-14 VITALS — BP 142/70 | HR 71 | Temp 97.4°F | Ht 70.0 in | Wt 211.0 lb

## 2021-12-14 DIAGNOSIS — S51012D Laceration without foreign body of left elbow, subsequent encounter: Secondary | ICD-10-CM | POA: Diagnosis not present

## 2021-12-14 DIAGNOSIS — H9111 Presbycusis, right ear: Secondary | ICD-10-CM

## 2021-12-14 DIAGNOSIS — Z23 Encounter for immunization: Secondary | ICD-10-CM

## 2021-12-14 DIAGNOSIS — E7800 Pure hypercholesterolemia, unspecified: Secondary | ICD-10-CM

## 2021-12-14 DIAGNOSIS — S51012A Laceration without foreign body of left elbow, initial encounter: Secondary | ICD-10-CM | POA: Insufficient documentation

## 2021-12-14 DIAGNOSIS — H9311 Tinnitus, right ear: Secondary | ICD-10-CM

## 2021-12-14 NOTE — Progress Notes (Signed)
Established Patient Office Visit  Subjective   Patient ID: Steven Bonilla, male    DOB: 1945/09/27  Age: 76 y.o. MRN: [id number]  Chief Complaint  Patient presents with   Tinnitus    Ringing in right ear little hearing loss symptoms x 1 month.     HPI follow-up of tinnitus and hearing loss in his right ear.  Tinnitus has been present over the last 2 or 3 months.  Believes that hearing loss has been a little bit more gradual.  Denies headache or spinning sensation.  Noise exposure during [employer] but none since.  Minor laceration on left elbow that is healing well.  Due for Tdap and pneumococcal 20.  Remains undecided about statin therapy.  ASCVD risk score is elevated.    Review of Systems  Constitutional: Negative.   HENT:  Positive for hearing loss and tinnitus.   Eyes:  Negative for blurred vision, discharge and redness.  Respiratory: Negative.    Cardiovascular: Negative.   Gastrointestinal:  Negative for abdominal pain.  Genitourinary: Negative.   Musculoskeletal: Negative.  Negative for myalgias.  Skin:  Negative for rash.  Neurological:  Negative for dizziness, tingling, loss of consciousness, weakness and headaches.  Endo/Heme/Allergies:  Negative for polydipsia.      Objective:     BP (!) 142/70 (BP Location: Left Arm, Patient Position: Sitting, Cuff Size: Normal)   Pulse 71   Temp (!) 97.4 F (36.3 C) (Temporal)   Ht '5\' 10"'$  (1.778 m)   Wt 211 lb (95.7 kg)   SpO2 98%   BMI 30.28 kg/m  BP Readings from Last 3 Encounters:  12/14/21 (!) 142/70  04/23/21 122/74  04/20/21 116/72   Wt Readings from Last 3 Encounters:  12/14/21 211 lb (95.7 kg)  04/23/21 213 lb 6.4 oz (96.8 kg)  04/20/21 214 lb (97.1 kg)      Physical Exam Constitutional:      General: He is not in acute distress.    Appearance: Normal appearance. He is not ill-appearing, toxic-appearing or diaphoretic.  HENT:     Head: Normocephalic and atraumatic.     Right Ear: Tympanic  membrane, ear canal and external ear normal.     Left Ear: Tympanic membrane, ear canal and external ear normal.     Mouth/Throat:     Mouth: Mucous membranes are moist.     Pharynx: Oropharynx is clear. No oropharyngeal exudate or posterior oropharyngeal erythema.  Eyes:     General: No scleral icterus.       Right eye: No discharge.        Left eye: No discharge.     Extraocular Movements: Extraocular movements intact.     Conjunctiva/sclera: Conjunctivae normal.     Pupils: Pupils are equal, round, and reactive to light.  Cardiovascular:     Rate and Rhythm: Normal rate and regular rhythm.  Pulmonary:     Effort: Pulmonary effort is normal. No respiratory distress.     Breath sounds: Normal breath sounds.  Abdominal:     General: Bowel sounds are normal.     Tenderness: There is no abdominal tenderness. There is no guarding.  Musculoskeletal:     Cervical back: No rigidity or tenderness.  Skin:    General: Skin is warm and dry.       Neurological:     Mental Status: He is alert and oriented to person, place, and time.  Psychiatric:        Mood and Affect: Mood  normal.        Behavior: Behavior normal.      No results found for any visits on 12/14/21.    The 10-year ASCVD risk score (Arnett DK, et al., 2019) is: 28.6%    Assessment & Plan:   Problem List Items Addressed This Visit       Nervous and Auditory   Presbycusis of right ear   Relevant Orders   Ambulatory referral to ENT     Other   Elevated LDL cholesterol level   Tinnitus of right ear   Relevant Orders   Ambulatory referral to ENT   Need for vaccination against Streptococcus pneumoniae   Relevant Orders   Pneumococcal conjugate vaccine 20-valent (Completed)   Laceration of left elbow - Primary   Relevant Orders   Tdap vaccine greater than or equal to 7yo IM (Completed)   Need for Tdap vaccination   Relevant Orders   Tdap vaccine greater than or equal to 7yo IM (Completed)    Return if  symptoms worsen or fail to improve.  Advised statin.  Would like to wait until his next checkup.  ENT referral tinnitus with hearing loss.  Libby Maw, MD

## 2022-02-25 IMAGING — US US SCROTUM
1 series · 14 of 25 positions shown · non-contrast
Comparison: None.

CLINICAL DATA: Testicular palpable abnormality

EXAM:
ULTRASOUND OF SCROTUM
TECHNIQUE: Complete ultrasound examination of the testicles, epididymis, and
other scrotal structures was performed.

[Series 1: us scrotum · 0.06mm/px · 37 acquisitions, 14 frames shown]
[im 1/37]
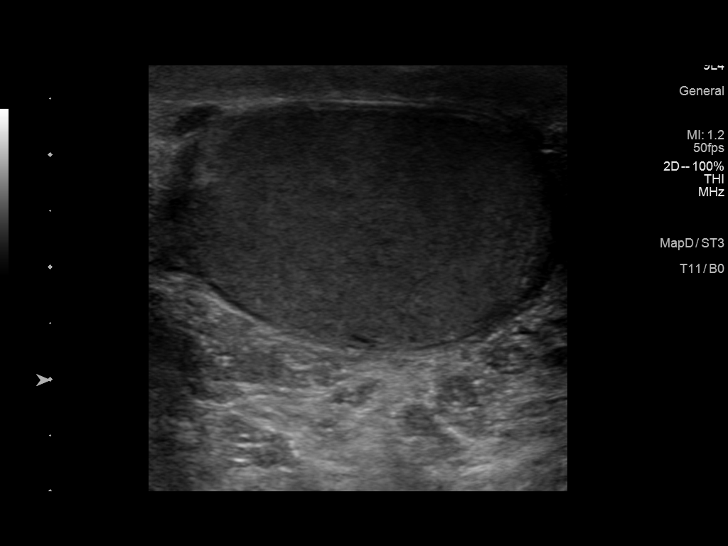
[im 4/37]
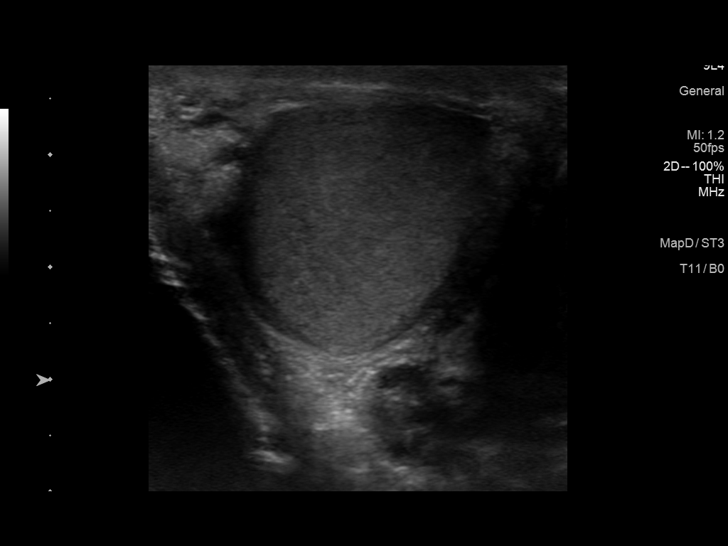
[im 7/37]
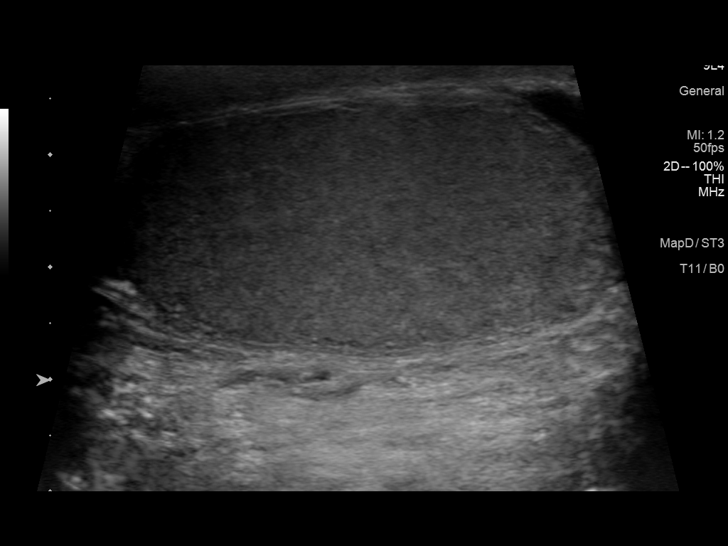
[im 10/37]
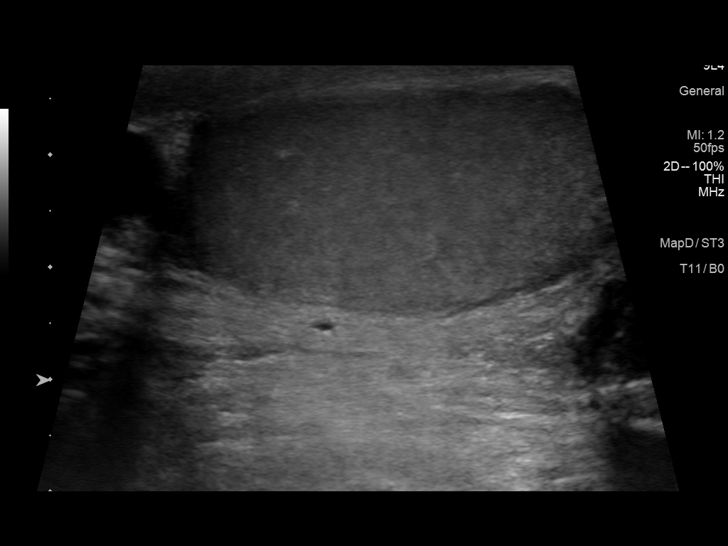
[im 13/37]
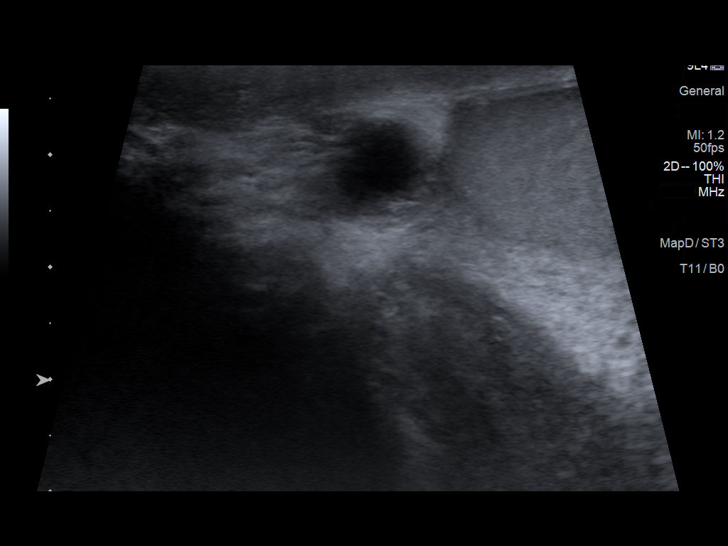
[im 14/37]
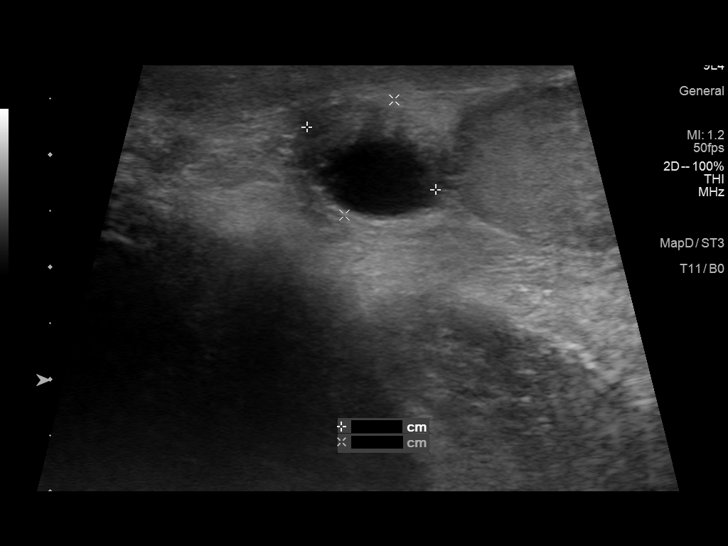
[im 17/37]
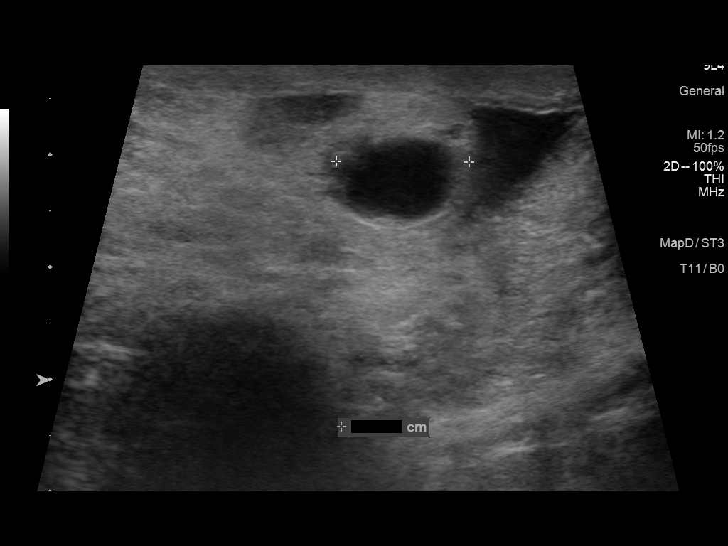
[im 20/37]
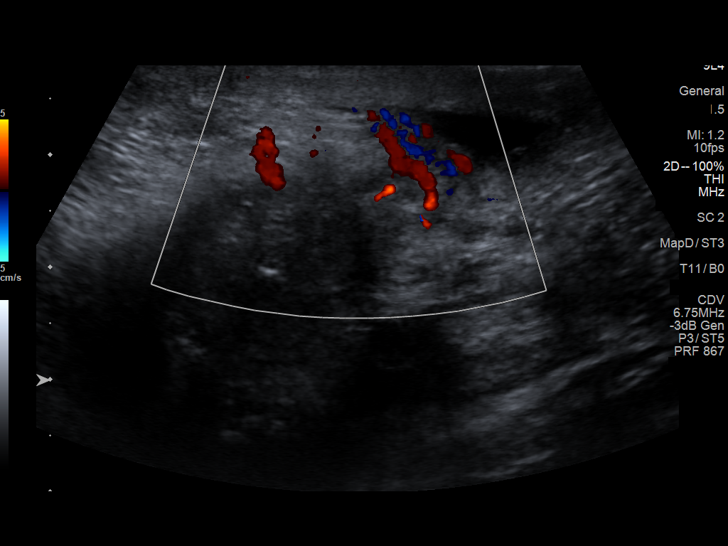
[im 23/37]
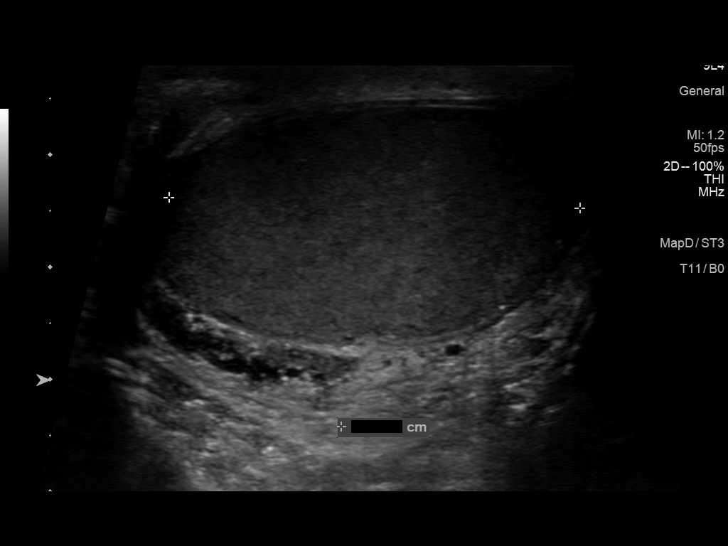
[im 25/37]
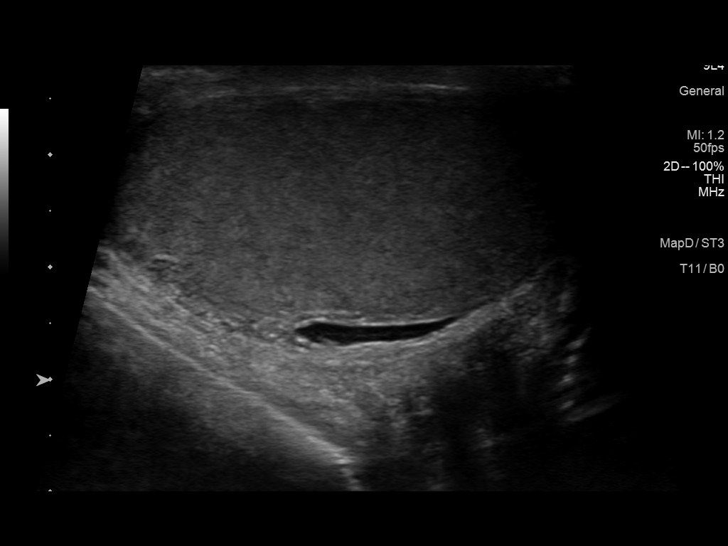
[im 28/37]
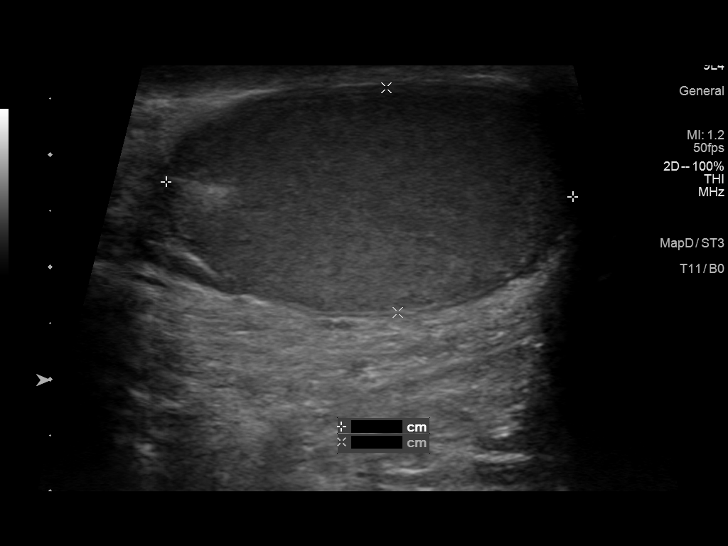
[im 31/37]
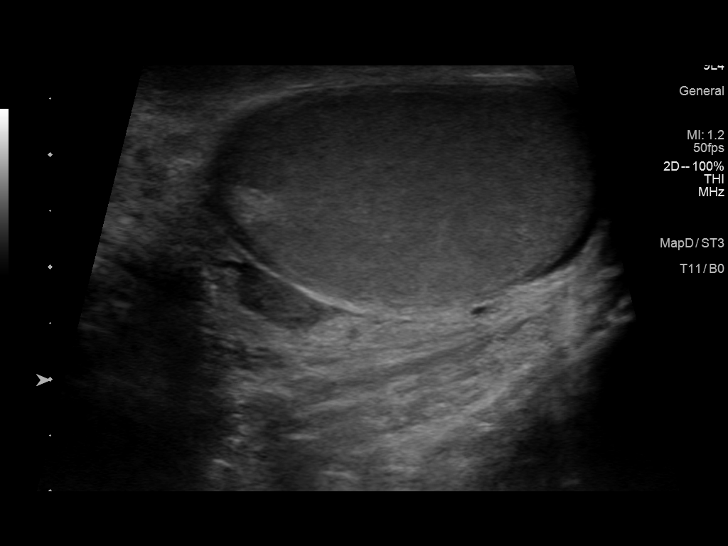
[im 34/37]
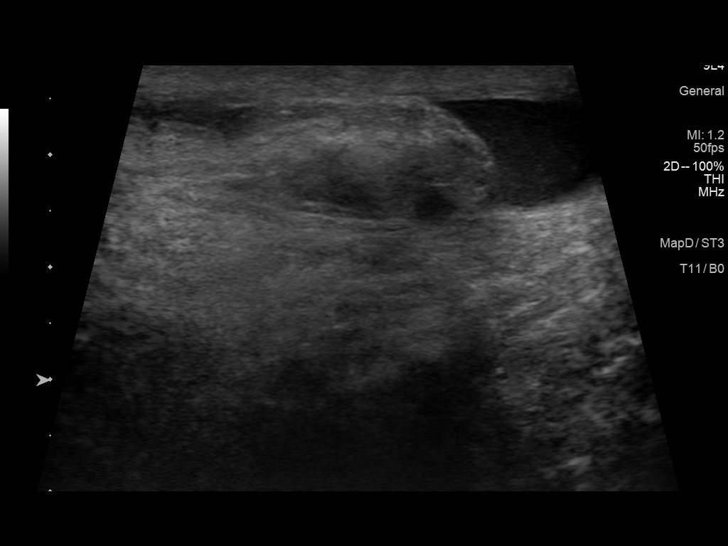
[im 37/37]
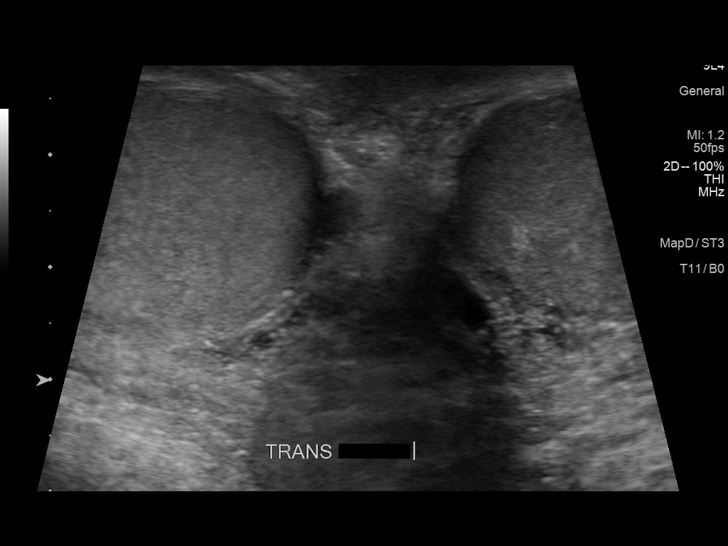

[14 of 25 positions shown; findings below may reference images not displayed]

FINDINGS: Right testicle

Measurements: 4.4 x 2.3 x 3.3 cm. No mass or microlithiasis
visualized.

Left testicle

Measurements: 3.6 x 2.0 x 3.7 cm. No mass or microlithiasis
visualized.

Right epididymis:  13 mm right epididymal cyst is noted.

Left epididymis:  Normal in size and appearance.

Hydrocele:  None visualized.

Varicocele:  None visualized.
IMPRESSION: Small right epididymal cyst is noted. No other focal abnormality is
seen.

## 2022-05-25 ENCOUNTER — Encounter: Payer: Self-pay | Admitting: Gastroenterology

## 2022-08-30 ENCOUNTER — Telehealth: Payer: Self-pay | Admitting: Family Medicine

## 2022-08-30 NOTE — Telephone Encounter (Signed)
Contacted Lauretta Grill to schedule their annual wellness visit. Appointment made for 09/06/22.  Rudell Cobb AWV direct phone # [phone]

## 2022-09-06 ENCOUNTER — Ambulatory Visit (INDEPENDENT_AMBULATORY_CARE_PROVIDER_SITE_OTHER): Payer: Medicare Other

## 2022-09-06 VITALS — Ht 69.5 in | Wt 199.0 lb

## 2022-09-06 DIAGNOSIS — Z0000 Encounter for general adult medical examination without abnormal findings: Secondary | ICD-10-CM | POA: Diagnosis not present

## 2022-09-06 NOTE — Patient Instructions (Signed)
Steven Bonilla , Thank you for taking time to come for your Medicare Wellness Visit. I appreciate your ongoing commitment to your health goals. Please review the following plan we discussed and let me know if I can assist you in the future.   These are the goals we discussed:  Goals      Patient Stated     09/06/2022, wants to get to 180 pounds        This is a list of the screening recommended for you and due dates:  Health Maintenance  Topic Date Due   Hepatitis C Screening: USPSTF Recommendation to screen - Ages 29-79 yo.  Never done   Colon Cancer Screening  02/19/2022   Flu Shot  12/09/2022   Medicare Annual Wellness Visit  09/06/2023   DTaP/Tdap/Td vaccine (3 - Td or Tdap) 12/15/2031   Pneumonia Vaccine  Completed   HPV Vaccine  Aged Out   COVID-19 Vaccine  Discontinued   Zoster (Shingles) Vaccine  Discontinued    Advanced directives: Please bring a copy of your POA (Power of Attorney) and/or Living Will to your next appointment.   Conditions/risks identified: none  Next appointment: Follow up in one year for your annual wellness visit.   Preventive Care 35 Years and Older, Male  Preventive care refers to lifestyle choices and visits with your health care provider that can promote health and wellness. What does preventive care include? A yearly physical exam. This is also called an annual well check. Dental exams once or twice a year. Routine eye exams. Ask your health care provider how often you should have your eyes checked. Personal lifestyle choices, including: Daily care of your teeth and gums. Regular physical activity. Eating a healthy diet. Avoiding tobacco and drug use. Limiting alcohol use. Practicing safe sex. Taking low doses of aspirin every day. Taking vitamin and mineral supplements as recommended by your health care provider. What happens during an annual well check? The services and screenings done by your health care provider during your annual  well check will depend on your age, overall health, lifestyle risk factors, and family history of disease. Counseling  Your health care provider may ask you questions about your: Alcohol use. Tobacco use. Drug use. Emotional well-being. Home and relationship well-being. Sexual activity. Eating habits. History of falls. Memory and ability to understand (cognition). Work and work Astronomer. Screening  You may have the following tests or measurements: Height, weight, and BMI. Blood pressure. Lipid and cholesterol levels. These may be checked every 5 years, or more frequently if you are over 37 years old. Skin check. Lung cancer screening. You may have this screening every year starting at age 58 if you have a 30-pack-year history of smoking and currently smoke or have quit within the past 15 years. Fecal occult blood test (FOBT) of the stool. You may have this test every year starting at age 26. Flexible sigmoidoscopy or colonoscopy. You may have a sigmoidoscopy every 5 years or a colonoscopy every 10 years starting at age 59. Prostate cancer screening. Recommendations will vary depending on your family history and other risks. Hepatitis C blood test. Hepatitis B blood test. Sexually transmitted disease (STD) testing. Diabetes screening. This is done by checking your blood sugar (glucose) after you have not eaten for a while (fasting). You may have this done every 1-3 years. Abdominal aortic aneurysm (AAA) screening. You may need this if you are a current or former smoker. Osteoporosis. You may be screened starting at age  70 if you are at high risk. Talk with your health care provider about your test results, treatment options, and if necessary, the need for more tests. Vaccines  Your health care provider may recommend certain vaccines, such as: Influenza vaccine. This is recommended every year. Tetanus, diphtheria, and acellular pertussis (Tdap, Td) vaccine. You may need a Td booster  every 10 years. Zoster vaccine. You may need this after age 72. Pneumococcal 13-valent conjugate (PCV13) vaccine. One dose is recommended after age 43. Pneumococcal polysaccharide (PPSV23) vaccine. One dose is recommended after age 49. Talk to your health care provider about which screenings and vaccines you need and how often you need them. This information is not intended to replace advice given to you by your health care provider. Make sure you discuss any questions you have with your health care provider. Document Released: 05/23/2015 Document Revised: 01/14/2016 Document Reviewed: 02/25/2015 Elsevier Interactive Patient Education  2017 [place] Prevention in the Home Falls can cause injuries. They can happen to people of all ages. There are many things you can do to make your home safe and to help prevent falls. What can I do on the outside of my home? Regularly fix the edges of walkways and driveways and fix any cracks. Remove anything that might make you trip as you walk through a door, such as a raised step or threshold. Trim any bushes or trees on the path to your home. Use bright outdoor lighting. Clear any walking paths of anything that might make someone trip, such as rocks or tools. Regularly check to see if handrails are loose or broken. Make sure that both sides of any steps have handrails. Any raised decks and porches should have guardrails on the edges. Have any leaves, snow, or ice cleared regularly. Use sand or salt on walking paths during winter. Clean up any spills in your garage right away. This includes oil or grease spills. What can I do in the bathroom? Use night lights. Install grab bars by the toilet and in the tub and shower. Do not use towel bars as grab bars. Use non-skid mats or decals in the tub or shower. If you need to sit down in the shower, use a plastic, non-slip stool. Keep the floor dry. Clean up any water that spills on the floor as soon  as it happens. Remove soap buildup in the tub or shower regularly. Attach bath mats securely with double-sided non-slip rug tape. Do not have throw rugs and other things on the floor that can make you trip. What can I do in the bedroom? Use night lights. Make sure that you have a light by your bed that is easy to reach. Do not use any sheets or blankets that are too big for your bed. They should not hang down onto the floor. Have a firm chair that has side arms. You can use this for support while you get dressed. Do not have throw rugs and other things on the floor that can make you trip. What can I do in the kitchen? Clean up any spills right away. Avoid walking on wet floors. Keep items that you use a lot in easy-to-reach places. If you need to reach something above you, use a strong step stool that has a grab bar. Keep electrical cords out of the way. Do not use floor polish or wax that makes floors slippery. If you must use wax, use non-skid floor wax. Do not have throw rugs and other  things on the floor that can make you trip. What can I do with my stairs? Do not leave any items on the stairs. Make sure that there are handrails on both sides of the stairs and use them. Fix handrails that are broken or loose. Make sure that handrails are as long as the stairways. Check any carpeting to make sure that it is firmly attached to the stairs. Fix any carpet that is loose or worn. Avoid having throw rugs at the top or bottom of the stairs. If you do have throw rugs, attach them to the floor with carpet tape. Make sure that you have a light switch at the top of the stairs and the bottom of the stairs. If you do not have them, ask someone to add them for you. What else can I do to help prevent falls? Wear shoes that: Do not have high heels. Have rubber bottoms. Are comfortable and fit you well. Are closed at the toe. Do not wear sandals. If you use a stepladder: Make sure that it is fully  opened. Do not climb a closed stepladder. Make sure that both sides of the stepladder are locked into place. Ask someone to hold it for you, if possible. Clearly mark and make sure that you can see: Any grab bars or handrails. First and last steps. Where the edge of each step is. Use tools that help you move around (mobility aids) if they are needed. These include: Canes. Walkers. Scooters. Crutches. Turn on the lights when you go into a dark area. Replace any light bulbs as soon as they burn out. Set up your furniture so you have a clear path. Avoid moving your furniture around. If any of your floors are uneven, fix them. If there are any pets around you, be aware of where they are. Review your medicines with your doctor. Some medicines can make you feel dizzy. This can increase your chance of falling. Ask your doctor what other things that you can do to help prevent falls. This information is not intended to replace advice given to you by your health care provider. Make sure you discuss any questions you have with your health care provider. Document Released: 02/20/2009 Document Revised: 10/02/2015 Document Reviewed: 05/31/2014 Elsevier Interactive Patient Education  2017 [employer].

## 2022-09-06 NOTE — Progress Notes (Signed)
I connected with  Steven Bonilla on 09/06/22 by a audio enabled telemedicine application and verified that I am speaking with the correct person using two identifiers.  Patient Location: Home  Provider Location: Office/Clinic  I discussed the limitations of evaluation and management by telemedicine. The patient expressed understanding and agreed to proceed.  Subjective:   Steven Bonilla is a 77 y.o. male who presents for Medicare Annual/Subsequent preventive examination.  Review of Systems     Cardiac Risk Factors include: advanced age (>93men, >87 women);male gender     Objective:    Today's Vitals   09/06/22 0911  Weight: 199 lb (90.3 kg)  Height: 5' 9.5" (1.765 m)   Body mass index is 28.97 kg/m.     09/06/2022    9:14 AM 08/18/2021    9:51 AM  Advanced Directives  Does Patient Have a Medical Advance Directive? Yes Yes  Type of Estate agent of [place];Living will Healthcare Power of [place];Living will  Copy of Healthcare Power of Attorney in Chart? No - copy requested No - copy requested    Current Medications (verified) Outpatient Encounter Medications as of 09/06/2022  Medication Sig   aspirin 81 MG chewable tablet Chew by mouth daily.   Multiple Vitamins-Minerals (MULTIVITAMIN WITH MINERALS) tablet Take 1 tablet by mouth daily. gummies   Omega-3 Fatty Acids (FISH OIL) 1000 MG CAPS Take by mouth.   tadalafil (CIALIS) 20 MG tablet Take 0.5-1 tablets (10-20 mg total) by mouth every other day as needed for erectile dysfunction.   No facility-administered encounter medications on file as of 09/06/2022.    Allergies (verified) Patient has no known allergies.   History: Past Medical History:  Diagnosis Date   Cataracts, bilateral    Past Surgical History:  Procedure Laterality Date   cataracts  2012   TONSILLECTOMY     age 36    Family History  Problem Relation Age of Onset   Colon polyps Neg Hx    Colon cancer Neg Hx     Esophageal cancer Neg Hx    Rectal cancer Neg Hx    Stomach cancer Neg Hx    Social History   Socioeconomic History   Marital status: Married    Spouse name: Not on file   Number of children: Not on file   Years of education: Not on file   Highest education level: Not on file  Occupational History   Not on file  Tobacco Use   Smoking status: Never   Smokeless tobacco: Never  Vaping Use   Vaping Use: Never used  Substance and Sexual Activity   Alcohol use: Yes    Alcohol/week: 7.0 standard drinks of alcohol    Types: 7 Glasses of wine per week    Comment: wine daily    Drug use: No   Sexual activity: Not on file  Other Topics Concern   Not on file  Social History Narrative   Not on file   Social Determinants of Health   Financial Resource Strain: Low Risk  (09/06/2022)   Overall Financial Resource Strain (CARDIA)    Difficulty of Paying Living Expenses: Not hard at all  Food Insecurity: No Food Insecurity (09/06/2022)   Hunger Vital Sign    Worried About Running Out of Food in the Last Year: Never true    Ran Out of Food in the Last Year: Never true  Transportation Needs: No Transportation Needs (09/06/2022)   PRAPARE - Transportation    Lack of Transportation (  Medical): No    Lack of Transportation (Non-Medical): No  Physical Activity: Sufficiently Active (09/06/2022)   Exercise Vital Sign    Days of Exercise per Week: 3 days    Minutes of Exercise per Session: 120 min  Stress: No Stress Concern Present (09/06/2022)   [employer] of Occupational Health - Occupational Stress Questionnaire    Feeling of Stress : Not at all  Social Connections: Socially Integrated (08/18/2021)   Social Connection and Isolation Panel [NHANES]    Frequency of Communication with Friends and Family: Twice a week    Frequency of Social Gatherings with Friends and Family: Twice a week    Attends Religious Services: 1 to 4 times per year    Active Member of [employer] or Organizations: Yes     Attends Banker Meetings: 1 to 4 times per year    Marital Status: Married    Tobacco Counseling Counseling given: Not Answered   Clinical Intake:  Pre-visit preparation completed: Yes  Pain : No/denies pain     Nutritional Status: BMI 25 -29 Overweight Nutritional Risks: None Diabetes: No  How often do you need to have someone help you when you read instructions, pamphlets, or other written materials from your doctor or pharmacy?: 1 - Never  Diabetic? no  Interpreter Needed?: No  Information entered by :: NAllen LPN   Activities of Daily Living    09/06/2022    9:15 AM  In your present state of health, do you have any difficulty performing the following activities:  Hearing? 0  Comment has ringing in right ear  Vision? 0  Difficulty concentrating or making decisions? 0  Walking or climbing stairs? 0  Dressing or bathing? 0  Doing errands, shopping? 0  Preparing Food and eating ? N  Using the Toilet? N  In the past six months, have you accidently leaked urine? N  Do you have problems with loss of bowel control? N  Managing your Medications? N  Managing your Finances? N  Housekeeping or managing your Housekeeping? N    Patient Care Team: Mliss Sax, MD as PCP - General (Family Medicine)  Indicate any recent Medical Services you may have received from other than [facility] providers in the past year (date may be approximate).     Assessment:   This is a routine wellness examination for Steven Bonilla.  Hearing/Vision screen Vision Screening - Comments:: Regular eye exams, [facility]  Dietary issues and exercise activities discussed: Current Exercise Habits: Home exercise routine, Type of exercise: Other - see comments (golf), Time (Minutes): > 60, Frequency (Times/Week): 3, Weekly Exercise (Minutes/Week): 0   Goals Addressed             This Visit's Progress    Patient Stated       09/06/2022, wants to get to 180 pounds        Depression Screen    09/06/2022    9:15 AM 12/14/2021   10:25 AM 08/18/2021    9:51 AM 08/18/2021    9:49 AM 04/20/2021    9:21 AM 10/07/2020    1:25 PM 10/07/2020   10:13 AM  PHQ 2/9 Scores  PHQ - 2 Score 0 0 0 0 0 0 0  PHQ- 9 Score      0     Fall Risk    09/06/2022    9:15 AM 12/14/2021   10:25 AM 08/18/2021    9:51 AM 04/20/2021    9:21 AM 10/07/2020  10:13 AM  Fall Risk   Falls in the past year? 0 0 0 0 0  Number falls in past yr: 0 0 0 0   Injury with Fall? 0  0    Risk for fall due to : No Fall Risks      Follow up Falls prevention discussed;Education provided;Falls evaluation completed  Falls evaluation completed      FALL RISK PREVENTION PERTAINING TO THE HOME:  Any stairs in or around the home? Yes  If so, are there any without handrails? No  Home free of loose throw rugs in walkways, pet beds, electrical cords, etc? Yes  Adequate lighting in your home to reduce risk of falls? Yes   ASSISTIVE DEVICES UTILIZED TO PREVENT FALLS:  Life alert? No  Use of a cane, walker or w/c? No  Grab bars in the bathroom? Yes  Shower chair or bench in shower? Yes  Elevated toilet seat or a handicapped toilet? No   TIMED UP AND GO:  Was the test performed? No .      Cognitive Function:        09/06/2022    9:16 AM  6CIT Screen  What Year? 0 points  What month? 0 points  What time? 0 points  Count back from 20 0 points  Months in reverse 0 points  Repeat phrase 0 points  Total Score 0 points    Immunizations Immunization History  Administered Date(s) Administered   DTP 11/09/2010   Influenza, High Dose Seasonal PF 02/20/2018, 03/06/2019   Influenza-Unspecified 02/27/2020, 02/28/2021   PFIZER(Purple Top)SARS-COV-2 Vaccination 06/17/2019, 07/12/2019, 01/29/2020   PNEUMOCOCCAL CONJUGATE-20 12/14/2021   Pneumococcal Conjugate-13 02/20/2018   Tdap 12/14/2021    TDAP status: Up to date  Flu Vaccine status: Up to date  Pneumococcal vaccine status: Up to  date  Covid-19 vaccine status: Completed vaccines  Qualifies for Shingles Vaccine? Yes   Zostavax completed No   Shingrix Completed?: No.    Education has been provided regarding the importance of this vaccine. Patient has been advised to call insurance company to determine out of pocket expense if they have not yet received this vaccine. Advised may also receive vaccine at local pharmacy or Health Dept. Verbalized acceptance and understanding.  Screening Tests Health Maintenance  Topic Date Due   Hepatitis C Screening  Never done   COLONOSCOPY (Pts 45-27yrs Insurance coverage will need to be confirmed)  02/19/2022   Medicare Annual Wellness (AWV)  08/19/2022   INFLUENZA VACCINE  12/09/2022   DTaP/Tdap/Td (3 - Td or Tdap) 12/15/2031   Pneumonia Vaccine 82+ Years old  Completed   HPV VACCINES  Aged Out   COVID-19 Vaccine  Discontinued   Zoster Vaccines- Shingrix  Discontinued    Health Maintenance  Health Maintenance Due  Topic Date Due   Hepatitis C Screening  Never done   COLONOSCOPY (Pts 45-31yrs Insurance coverage will need to be confirmed)  02/19/2022   Medicare Annual Wellness (AWV)  08/19/2022    Colorectal cancer screening: Type of screening: Colonoscopy. Completed 02/20/2019. Repeat every 3 years  Lung Cancer Screening: (Low Dose CT Chest recommended if Age 47-80 years, 30 pack-year currently smoking OR have quit w/in 15years.) does not qualify.   Lung Cancer Screening Referral: no  Additional Screening:  Hepatitis C Screening: does qualify;   Vision Screening: Recommended annual ophthalmology exams for early detection of glaucoma and other disorders of the eye. Is the patient up to date with their annual eye exam?  Yes  Who is the provider or what is the name of the office in which the patient attends annual eye exams? [facility] If pt is not established with a provider, would they like to be referred to a provider to establish care? No .   Dental  Screening: Recommended annual dental exams for proper oral hygiene  Community Resource Referral / Chronic Care Management: CRR required this visit?  No   CCM required this visit?  No      Plan:     I have personally reviewed and noted the following in the patient's chart:   Medical and social history Use of alcohol, tobacco or illicit drugs  Current medications and supplements including opioid prescriptions. Patient is not currently taking opioid prescriptions. Functional ability and status Nutritional status Physical activity Advanced directives List of other physicians Hospitalizations, surgeries, and ER visits in previous 12 months Vitals Screenings to include cognitive, depression, and falls Referrals and appointments  In addition, I have reviewed and discussed with patient certain preventive protocols, quality metrics, and best practice recommendations. A written personalized care plan for preventive services as well as general preventive health recommendations were provided to patient.     Barb Merino, LPN   1/61/0960   Nurse Notes: none  Due to this being a virtual visit, the after visit summary with patients personalized plan was offered to patient via mail or my-chart. Patient would like to access on my-chart

## 2023-08-15 ENCOUNTER — Other Ambulatory Visit (INDEPENDENT_AMBULATORY_CARE_PROVIDER_SITE_OTHER): Payer: Self-pay

## 2023-08-15 ENCOUNTER — Ambulatory Visit: Admitting: Orthopedic Surgery

## 2023-08-15 ENCOUNTER — Encounter: Payer: Self-pay | Admitting: Orthopedic Surgery

## 2023-08-15 DIAGNOSIS — M25561 Pain in right knee: Secondary | ICD-10-CM

## 2023-08-15 DIAGNOSIS — M25461 Effusion, right knee: Secondary | ICD-10-CM | POA: Diagnosis not present

## 2023-08-17 ENCOUNTER — Encounter: Payer: Self-pay | Admitting: Orthopedic Surgery

## 2023-08-17 MED ORDER — METHYLPREDNISOLONE ACETATE 40 MG/ML IJ SUSP
40.0000 mg | INTRAMUSCULAR | Status: AC | PRN
  Administered 2023-08-15: 40 mg via INTRA_ARTICULAR

## 2023-08-17 MED ORDER — LIDOCAINE HCL 1 % IJ SOLN
5.0000 mL | INTRAMUSCULAR | Status: AC | PRN
  Administered 2023-08-15: 5 mL

## 2023-08-17 MED ORDER — BUPIVACAINE HCL 0.25 % IJ SOLN
4.0000 mL | INTRAMUSCULAR | Status: AC | PRN
  Administered 2023-08-15: 4 mL via INTRA_ARTICULAR

## 2023-08-17 NOTE — Progress Notes (Signed)
 Office Visit Note   Patient: Steven Bonilla           Date of Birth: Jul 09, 1945           MRN: [id number] Visit Date: 08/15/2023 Requested by: Mliss Sax, MD [address] [place],  [place] [address] PCP: Mliss Sax, MD  Subjective: Chief Complaint  Patient presents with   Right Knee - Pain    HPI: Steven Bonilla is a 78 y.o. male who presents to the office reporting right knee pain 4 weeks duration.  He thinks it started while he was working out.  On the leg curl machine.  The pain does not wake him from sleep at night.  Denies any locking or popping but does report medial sided pain.  He plays golf.  No problem before 4 weeks ago.  He does not take much in terms of daily medicines.  He has been icing the knee.  He is unable to run up the stairs.  Describes both medial and lateral sided pain.  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  Patient also reports left-sided trigger finger for the past 10 days.  This is a secondary concern and does not interfere with his golf.              ROS: All systems reviewed are negative as they relate to the chief complaint within the history of present illness.  Patient denies fevers or chills.  Assessment & Plan: Visit Diagnoses:  1. Right knee pain, unspecified chronicity     Plan: Impression is right knee effusion with possible degenerative meniscal tear.  Left-sided trigger finger also present.  Right knee aspiration and injection performed today.  If this does not resolve his symptoms and the effusion recurs or if he develops mechanical symptoms MRI scanning is indicated.  Radiographs show minimal arthritis at this time.  Collateral crucial ligaments are stable.  He will let us know in 6 weeks how he is doing and whether or not we need to proceed with MRI scanning.  Follow-Up Instructions: No follow-ups on file.   Orders:  Orders Placed This Encounter  Procedures   XR KNEE 3 VIEW RIGHT   No orders of  the defined types were placed in this encounter.     Procedures: Large Joint Inj: R knee on 08/15/2023 10:39 PM Indications: diagnostic evaluation, joint swelling and pain Details: 18 G 1.5 in needle, superolateral approach  Arthrogram: No  Medications: 5 mL lidocaine 1 %; 40 mg methylPREDNISolone acetate 40 MG/ML; 4 mL bupivacaine 0.25 % Outcome: tolerated well, no immediate complications Procedure, treatment alternatives, risks and benefits explained, specific risks discussed. Consent was given by the patient. Immediately prior to procedure a time out was called to verify the correct patient, procedure, equipment, support staff and site/side marked as required. Patient was prepped and draped in the usual sterile fashion.       Clinical Data: No additional findings.  Objective: Vital Signs: There were no vitals taken for this visit.  Physical Exam:  Constitutional: Patient appears well-developed HEENT:  Head: Normocephalic Eyes:EOM are normal Neck: Normal range of motion Cardiovascular: Normal rate Pulmonary/chest: Effort normal Neurologic: Patient is alert Skin: Skin is warm Psychiatric: Patient has normal mood and affect  Ortho Exam: Ortho exam demonstrates full range of motion of right knee.  Mild effusion is present.  Has medial greater than lateral joint line tenderness with positive Murray compression testing for medial sided pathology.  Collateral  crucial ligaments are stable.  Excellent muscle tone in upper and lower legs.  Extensor mechanism intact and nontender.  No groin pain with internal or external rotation of the leg.  Specialty Comments:  No specialty comments available.  Imaging: No results found.   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   Encounter for health maintenance examination with abnormal findings 12/07/2018   Lateral epicondylitis, left elbow 10/28/2016   Past Medical History:  Diagnosis Date   Cataracts, bilateral     Family History  Problem Relation Age of Onset   Colon polyps Neg Hx    Colon cancer Neg Hx    Esophageal cancer Neg Hx    Rectal cancer Neg Hx    Stomach cancer Neg Hx     Past Surgical History:  Procedure Laterality Date   cataracts  2012   TONSILLECTOMY     age 7    Social History   Occupational History   Not on file  Tobacco Use   Smoking status: Never   Smokeless tobacco: Never  Vaping Use   Vaping status: Never Used  Substance and Sexual Activity   Alcohol use: Yes    Alcohol/week: 7.0 standard drinks of alcohol    Types: 7 Glasses of wine per week    Comment: wine daily    Drug use: No   Sexual activity: Not on file

## 2023-08-22 ENCOUNTER — Telehealth: Payer: Self-pay

## 2023-08-22 NOTE — Telephone Encounter (Signed)
-----   Message from Marykay Snipes sent at 08/17/2023 11:01 PM EDT ----- Steven Bonilla can you let me know or send me a reminder in about 4 weeks to find out how Steven Bonilla is doing with his knee.  Thanks

## 2023-08-22 NOTE — Telephone Encounter (Signed)
 Holding as a reminder for Dr Rozelle Corning

## 2023-09-09 ENCOUNTER — Ambulatory Visit (INDEPENDENT_AMBULATORY_CARE_PROVIDER_SITE_OTHER): Payer: Medicare Other

## 2023-09-09 DIAGNOSIS — Z0000 Encounter for general adult medical examination without abnormal findings: Secondary | ICD-10-CM | POA: Diagnosis not present

## 2023-09-09 NOTE — Progress Notes (Signed)
 Subjective:   Steven Bonilla is a 78 y.o. who presents for a Medicare Wellness preventive visit.  Visit Complete: Virtual I connected with  Steven Bonilla on 09/09/23 by a audio enabled telemedicine application and verified that I am speaking with the correct person using two identifiers.  Patient Location: Home  Provider Location: Office/Clinic  I discussed the limitations of evaluation and management by telemedicine. The patient expressed understanding and agreed to proceed.  Vital Signs: Because this visit was a virtual/telehealth visit, some criteria may be missing or patient reported. Any vitals not documented were not able to be obtained and vitals that have been documented are patient reported.  VideoError- Librarian, academic were attempted between this provider and patient, however failed, due to patient having technical difficulties OR patient did not have access to video capability.  We continued and completed visit with audio only.   Persons Participating in Visit: Patient.  AWV Questionnaire: No: Patient Medicare AWV questionnaire was not completed prior to this visit.  Cardiac Risk Factors include: advanced age (>34men, >84 women);male gender     Objective:    Today's Vitals   There is no height or weight on file to calculate BMI.     09/09/2023    8:55 AM 09/06/2022    9:14 AM 08/18/2021    9:51 AM  Advanced Directives  Does Patient Have a Medical Advance Directive? No Yes Yes  Type of Special educational needs teacher of [place];Living will Healthcare Power of [place];Living will  Copy of Healthcare Power of Attorney in Chart?  No - copy requested No - copy requested    Current Medications (verified) Outpatient Encounter Medications as of 09/09/2023  Medication Sig   aspirin 81 MG chewable tablet Chew by mouth daily.   Coenzyme Q10 (CO Q 10 PO) Take by mouth. Takes 30 ml   Omega-3 Fatty Acids (FISH OIL) 1000 MG CAPS Take by  mouth.   TURMERIC PO Take by mouth. Takes 30 ml daily   Multiple Vitamins-Minerals (MULTIVITAMIN WITH MINERALS) tablet Take 1 tablet by mouth daily. gummies (Patient not taking: Reported on 09/09/2023)   tadalafil  (CIALIS ) 20 MG tablet Take 0.5-1 tablets (10-20 mg total) by mouth every other day as needed for erectile dysfunction. (Patient not taking: Reported on 09/09/2023)   No facility-administered encounter medications on file as of 09/09/2023.    Allergies (verified) Patient has no known allergies.   History: Past Medical History:  Diagnosis Date   Cataracts, bilateral    Past Surgical History:  Procedure Laterality Date   cataracts  2012   TONSILLECTOMY     age 72    Family History  Problem Relation Age of Onset   Colon polyps Neg Hx    Colon cancer Neg Hx    Esophageal cancer Neg Hx    Rectal cancer Neg Hx    Stomach cancer Neg Hx    Social History   Socioeconomic History   Marital status: Married    Spouse name: Not on file   Number of children: Not on file   Years of education: Not on file   Highest education level: Not on file  Occupational History   Not on file  Tobacco Use   Smoking status: Never   Smokeless tobacco: Never  Vaping Use   Vaping status: Never Used  Substance and Sexual Activity   Alcohol use: Yes    Alcohol/week: 7.0 standard drinks of alcohol    Types: 7 Glasses of wine  per week    Comment: wine daily    Drug use: No   Sexual activity: Not on file  Other Topics Concern   Not on file  Social History Narrative   Not on file   Social Drivers of Health   Financial Resource Strain: Low Risk  (09/09/2023)   Overall Financial Resource Strain (CARDIA)    Difficulty of Paying Living Expenses: Not hard at all  Food Insecurity: No Food Insecurity (09/09/2023)   Hunger Vital Sign    Worried About Running Out of Food in the Last Year: Never true    Ran Out of Food in the Last Year: Never true  Transportation Needs: No Transportation Needs  (09/09/2023)   PRAPARE - Administrator, Civil Service (Medical): No    Lack of Transportation (Non-Medical): No  Physical Activity: Inactive (09/09/2023)   Exercise Vital Sign    Days of Exercise per Week: 0 days    Minutes of Exercise per Session: 0 min  Stress: No Stress Concern Present (09/09/2023)   [employer] of Occupational Health - Occupational Stress Questionnaire    Feeling of Stress : Not at all  Social Connections: Unknown (09/09/2023)   Social Connection and Isolation Panel [NHANES]    Frequency of Communication with Friends and Family: Once a week    Frequency of Social Gatherings with Friends and Family: Not on file    Attends Religious Services: 1 to 4 times per year    Active Member of [employer] or Organizations: Yes    Attends Engineer, structural: More than 4 times per year    Marital Status: Married    Tobacco Counseling Counseling given: Not Answered    Clinical Intake:  Pre-visit preparation completed: Yes  Pain : No/denies pain     Nutritional Risks: None Diabetes: No  Lab Results  Component Value Date   HGBA1C 6.0 04/20/2021     How often do you need to have someone help you when you read instructions, pamphlets, or other written materials from your doctor or pharmacy?: 1 - Never  Interpreter Needed?: No  Information entered by :: NAllen LPN   Activities of Daily Living     09/09/2023    8:47 AM  In your present state of health, do you have any difficulty performing the following activities:  Hearing? 0  Comment decreased hearing right ear  Vision? 0  Difficulty concentrating or making decisions? 0  Walking or climbing stairs? 0  Dressing or bathing? 0  Doing errands, shopping? 0  Preparing Food and eating ? N  Using the Toilet? N  In the past six months, have you accidently leaked urine? N  Do you have problems with loss of bowel control? N  Managing your Medications? N  Managing your Finances? N  Housekeeping or  managing your Housekeeping? N    Patient Care Team: Tonna Frederic, MD as PCP - General (Family Medicine)  Indicate any recent Medical Services you may have received from other than [facility] providers in the past year (date may be approximate).     Assessment:   This is a routine wellness examination for Steven Bonilla.  Hearing/Vision screen Hearing Screening - Comments:: Slight decrease in right ear Vision Screening - Comments:: Regular eye exams, [facility]   Goals Addressed             This Visit's Progress    Patient Stated       09/09/2023, wants to start exercising again  Depression Screen     09/09/2023    8:56 AM 09/06/2022    9:15 AM 12/14/2021   10:25 AM 08/18/2021    9:51 AM 08/18/2021    9:49 AM 04/20/2021    9:21 AM 10/07/2020    1:25 PM  PHQ 2/9 Scores  PHQ - 2 Score 0 0 0 0 0 0 0  PHQ- 9 Score 0      0    Fall Risk     09/09/2023    8:56 AM 09/06/2022    9:15 AM 12/14/2021   10:25 AM 08/18/2021    9:51 AM 04/20/2021    9:21 AM  Fall Risk   Falls in the past year? 0 0 0 0 0  Number falls in past yr: 0 0 0 0 0  Injury with Fall? 0 0  0   Risk for fall due to : No Fall Risks No Fall Risks     Follow up Falls prevention discussed;Falls evaluation completed Falls prevention discussed;Education provided;Falls evaluation completed  Falls evaluation completed     MEDICARE RISK AT HOME:  Medicare Risk at Home Any stairs in or around the home?: Yes If so, are there any without handrails?: No Home free of loose throw rugs in walkways, pet beds, electrical cords, etc?: Yes Adequate lighting in your home to reduce risk of falls?: Yes Life alert?: No Use of a cane, walker or w/c?: No Grab bars in the bathroom?: Yes Shower chair or bench in shower?: Yes Elevated toilet seat or a handicapped toilet?: No  TIMED UP AND GO:  Was the test performed?  No  Cognitive Function: 6CIT completed        09/09/2023    8:57 AM 09/06/2022    9:16 AM  6CIT Screen   What Year? 0 points 0 points  What month? 0 points 0 points  What time? 0 points 0 points  Count back from 20 0 points 0 points  Months in reverse 0 points 0 points  Repeat phrase 2 points 0 points  Total Score 2 points 0 points    Immunizations Immunization History  Administered Date(s) Administered   DTP 11/09/2010   Influenza, High Dose Seasonal PF 02/20/2018, 03/06/2019   Influenza-Unspecified 02/27/2020, 02/28/2021   PFIZER(Purple Top)SARS-COV-2 Vaccination 06/17/2019, 07/12/2019, 01/29/2020   PNEUMOCOCCAL CONJUGATE-20 12/14/2021   Pneumococcal Conjugate-13 02/20/2018   Tdap 12/14/2021    Screening Tests Health Maintenance  Topic Date Due   Hepatitis C Screening  Never done   Colonoscopy  02/19/2022   INFLUENZA VACCINE  12/09/2023   Medicare Annual Wellness (AWV)  09/08/2024   DTaP/Tdap/Td (3 - Td or Tdap) 12/15/2031   Pneumonia Vaccine 83+ Years old  Completed   HPV VACCINES  Aged Out   Meningococcal B Vaccine  Aged Out   COVID-19 Vaccine  Discontinued   Zoster Vaccines- Shingrix  Discontinued    Health Maintenance  Health Maintenance Due  Topic Date Due   Hepatitis C Screening  Never done   Colonoscopy  02/19/2022   Health Maintenance Items Addressed: States good with colonoscopy. Hep C screening due  Additional Screening:  Vision Screening: Recommended annual ophthalmology exams for early detection of glaucoma and other disorders of the eye.  Dental Screening: Recommended annual dental exams for proper oral hygiene  Community Resource Referral / Chronic Care Management: CRR required this visit?  No   CCM required this visit?  No     Plan:     I have personally reviewed  and noted the following in the patient's chart:   Medical and social history Use of alcohol, tobacco or illicit drugs  Current medications and supplements including opioid prescriptions. Patient is not currently taking opioid prescriptions. Functional ability and  status Nutritional status Physical activity Advanced directives List of other physicians Hospitalizations, surgeries, and ER visits in previous 12 months Vitals Screenings to include cognitive, depression, and falls Referrals and appointments  In addition, I have reviewed and discussed with patient certain preventive protocols, quality metrics, and best practice recommendations. A written personalized care plan for preventive services as well as general preventive health recommendations were provided to patient.     Areatha Beecham, LPN   4/0/9811   After Visit Summary: (MyChart) Due to this being a telephonic visit, the after visit summary with patients personalized plan was offered to patient via MyChart   Notes: Nothing significant to report at this time.

## 2023-09-09 NOTE — Patient Instructions (Signed)
 Steven Bonilla , Thank you for taking time to come for your Medicare Wellness Visit. I appreciate your ongoing commitment to your health goals. Please review the following plan we discussed and let me know if I can assist you in the future.   Referrals/Orders/Follow-Ups/Clinician Recommendations: none  This is a list of the screening recommended for you and due dates:  Health Maintenance  Topic Date Due   Hepatitis C Screening  Never done   Colon Cancer Screening  02/19/2022   Flu Shot  12/09/2023   Medicare Annual Wellness Visit  09/08/2024   DTaP/Tdap/Td vaccine (3 - Td or Tdap) 12/15/2031   Pneumonia Vaccine  Completed   HPV Vaccine  Aged Out   Meningitis B Vaccine  Aged Out   COVID-19 Vaccine  Discontinued   Zoster (Shingles) Vaccine  Discontinued    Advanced directives: (ACP Link)Information on Advanced Care Planning can be found at [place]  [facility] [place] Advance Health Care Directives Advance Health Care Directives. [email or web address]   Next Medicare Annual Wellness Visit scheduled for next year: Yes  Have you seen your provider in the last 6 months (3 months if uncontrolled diabetes)? No, has appointment on 5/15  insert Preventive Care attachment Insert FALL PREVENTION attachment if needed

## 2023-09-14 ENCOUNTER — Ambulatory Visit: Admitting: Orthopedic Surgery

## 2023-09-15 ENCOUNTER — Encounter (HOSPITAL_COMMUNITY): Payer: Self-pay

## 2023-09-22 ENCOUNTER — Ambulatory Visit: Admitting: Family Medicine

## 2023-09-26 ENCOUNTER — Ambulatory Visit (INDEPENDENT_AMBULATORY_CARE_PROVIDER_SITE_OTHER): Admitting: Family Medicine

## 2023-09-26 ENCOUNTER — Encounter: Payer: Self-pay | Admitting: Family Medicine

## 2023-09-26 VITALS — BP 126/72 | HR 71 | Temp 97.2°F | Ht 69.0 in | Wt 212.2 lb

## 2023-09-26 DIAGNOSIS — E7800 Pure hypercholesterolemia, unspecified: Secondary | ICD-10-CM

## 2023-09-26 DIAGNOSIS — Z131 Encounter for screening for diabetes mellitus: Secondary | ICD-10-CM

## 2023-09-26 DIAGNOSIS — H9313 Tinnitus, bilateral: Secondary | ICD-10-CM | POA: Diagnosis not present

## 2023-09-26 NOTE — Progress Notes (Signed)
 Established Patient Office Visit   Subjective:  Patient ID: Steven Bonilla, male    DOB: 27-Oct-1945  Age: 78 y.o. MRN: [id number]  Chief Complaint  Patient presents with   Tinnitus    Pt Complains of ringing in bilateral ears x 6 months.     HPI Encounter Diagnoses  Name Primary?   Tinnitus aurium, bilateral Yes   Elevated LDL cholesterol level    Screening for diabetes mellitus    Follow-up of above.  57-month history of tinnitus that is now in both ears been constant.  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  Last seen in August 2023.  ASCVD risk score was mildly elevated secondary mostly to age.  A1c was in the prediabetic range.   Review of Systems  Constitutional: Negative.   HENT:  Positive for hearing loss and tinnitus.   Eyes:  Negative for blurred vision, discharge and redness.  Respiratory: Negative.    Cardiovascular: Negative.   Gastrointestinal:  Negative for abdominal pain.  Genitourinary: Negative.   Musculoskeletal: Negative.  Negative for myalgias.  Skin:  Negative for rash.  Neurological:  Negative for tingling, loss of consciousness and weakness.  Endo/Heme/Allergies:  Negative for polydipsia.     Current Outpatient Medications:    aspirin 81 MG chewable tablet, Chew by mouth daily., Disp: , Rfl:    Coenzyme Q10 (CO Q 10 PO), Take by mouth. Takes 30 ml, Disp: , Rfl:    Multiple Vitamins-Minerals (MULTIVITAMIN WITH MINERALS) tablet, Take 1 tablet by mouth daily. gummies, Disp: , Rfl:    Omega-3 Fatty Acids (FISH OIL) 1000 MG CAPS, Take by mouth., Disp: , Rfl:    tadalafil  (CIALIS ) 20 MG tablet, Take 0.5-1 tablets (10-20 mg total) by mouth every other day as needed for erectile dysfunction., Disp: 5 tablet, Rfl: 11   TURMERIC PO, Take by mouth. Takes 30 ml daily, Disp: , Rfl:    Objective:     BP 126/72 (Cuff Size: Normal)   Pulse 71   Temp (!) 97.2 F (36.2 C) (Temporal)   Ht 5\' 9"  (1.753 m)   Wt 212 lb 3.2 oz (96.3 kg)   SpO2  97%   BMI 31.34 kg/m  BP Readings from Last 3 Encounters:  09/26/23 126/72  12/14/21 (!) 142/70  04/23/21 122/74   Wt Readings from Last 3 Encounters:  09/26/23 212 lb 3.2 oz (96.3 kg)  09/06/22 199 lb (90.3 kg)  12/14/21 211 lb (95.7 kg)      Physical Exam Constitutional:      General: He is not in acute distress.    Appearance: Normal appearance. He is not ill-appearing, toxic-appearing or diaphoretic.  HENT:     Head: Normocephalic and atraumatic.     Right Ear: Tympanic membrane, ear canal and external ear normal.     Left Ear: Tympanic membrane, ear canal and external ear normal.     Mouth/Throat:     Mouth: Mucous membranes are moist.     Pharynx: Oropharynx is clear. No oropharyngeal exudate or posterior oropharyngeal erythema.  Eyes:     General: No scleral icterus.       Right eye: No discharge.        Left eye: No discharge.     Extraocular Movements: Extraocular movements intact.     Conjunctiva/sclera: Conjunctivae normal.     Pupils: Pupils are equal, round, and reactive to light.  Cardiovascular:     Rate and Rhythm: Normal rate and regular rhythm.  Pulmonary:     Effort: Pulmonary effort is normal. No respiratory distress.     Breath sounds: Normal breath sounds. No wheezing, rhonchi or rales.  Abdominal:     General: Bowel sounds are normal.  Musculoskeletal:     Cervical back: No rigidity or tenderness.  Lymphadenopathy:     Cervical: No cervical adenopathy.  Skin:    General: Skin is warm and dry.  Neurological:     Mental Status: He is alert and oriented to person, place, and time.  Psychiatric:        Mood and Affect: Mood normal.        Behavior: Behavior normal.      No results found for any visits on 09/26/23.    The 10-year ASCVD risk score (Arnett DK, et al., 2019) is: 26.8%    Assessment & Plan:   Tinnitus aurium, bilateral -     Ambulatory referral to ENT  Elevated LDL cholesterol level -     Comprehensive metabolic panel  with GFR; Future -     Lipid panel; Future  Screening for diabetes mellitus -     Comprehensive metabolic panel with GFR; Future -     Hemoglobin A1c; Future    Return in about 6 months (around 03/28/2024), or if symptoms worsen or fail to improve, for annual physical.  Information was given on preventing elevated cholesterol.  Information was given on prediabetes.  Recommended exercise and weight loss to prevent the progression to diabetes.  Discussed using a statin to mitigate his risk and he declined taking 1.  Tonna Frederic, MD

## 2023-09-30 ENCOUNTER — Other Ambulatory Visit (INDEPENDENT_AMBULATORY_CARE_PROVIDER_SITE_OTHER)

## 2023-09-30 DIAGNOSIS — E7800 Pure hypercholesterolemia, unspecified: Secondary | ICD-10-CM

## 2023-09-30 DIAGNOSIS — Z131 Encounter for screening for diabetes mellitus: Secondary | ICD-10-CM | POA: Diagnosis not present

## 2023-09-30 LAB — COMPREHENSIVE METABOLIC PANEL WITH GFR
ALT: 13 U/L (ref 0–53)
AST: 16 U/L (ref 0–37)
Albumin: 4.3 g/dL (ref 3.5–5.2)
Alkaline Phosphatase: 38 U/L — ABNORMAL LOW (ref 39–117)
BUN: 17 mg/dL (ref 6–23)
CO2: 24 meq/L (ref 19–32)
Calcium: 9 mg/dL (ref 8.4–10.5)
Chloride: 102 meq/L (ref 96–112)
Creatinine, Ser: 1.19 mg/dL (ref 0.40–1.50)
GFR: 58.83 mL/min — ABNORMAL LOW (ref >60.00)
Glucose, Bld: 103 mg/dL — ABNORMAL HIGH (ref 70–99)
Potassium: 4.2 meq/L (ref 3.5–5.1)
Sodium: 137 meq/L (ref 135–145)
Total Bilirubin: 1.1 mg/dL (ref 0.2–1.2)
Total Protein: 6.7 g/dL (ref 6.0–8.3)

## 2023-09-30 LAB — LIPID PANEL
Cholesterol: 248 mg/dL — ABNORMAL HIGH (ref 0–200)
HDL: 56.9 mg/dL (ref >39.00)
LDL Cholesterol: 168 mg/dL — ABNORMAL HIGH (ref 0–99)
NonHDL: 190.93
Total CHOL/HDL Ratio: 4
Triglycerides: 113 mg/dL (ref 0.0–149.0)
VLDL: 22.6 mg/dL (ref 0.0–40.0)

## 2023-09-30 LAB — HEMOGLOBIN A1C: Hgb A1c MFr Bld: 6.1 % (ref 4.6–6.5)

## 2023-10-03 ENCOUNTER — Ambulatory Visit: Payer: Self-pay | Admitting: Family Medicine

## 2023-10-03 NOTE — Progress Notes (Signed)
 LDL cholesterol or so-called "bad cholesterol" is significantly elevated.  I am recommending that you take a cholesterol medicine.  Please let us  know.

## 2023-12-15 ENCOUNTER — Institutional Professional Consult (permissible substitution) (INDEPENDENT_AMBULATORY_CARE_PROVIDER_SITE_OTHER): Admitting: Otolaryngology

## 2023-12-15 ENCOUNTER — Ambulatory Visit (INDEPENDENT_AMBULATORY_CARE_PROVIDER_SITE_OTHER): Admitting: Audiology

## 2024-03-12 ENCOUNTER — Encounter: Payer: Self-pay | Admitting: Radiology

## 2024-04-02 ENCOUNTER — Ambulatory Visit: Admitting: Family Medicine

## 2024-09-14 ENCOUNTER — Ambulatory Visit
